# Patient Record
Sex: Female | Born: 1995 | Race: Black or African American | Hispanic: No | Marital: Single | Smoking: Current every day smoker
Health system: Southern US, Community
[De-identification: ages and names within clinical notes are randomized; demographics above are authoritative.]

## PROBLEM LIST (undated history)

## (undated) DIAGNOSIS — J45909 Unspecified asthma, uncomplicated: Secondary | ICD-10-CM

## (undated) DIAGNOSIS — J45901 Unspecified asthma with (acute) exacerbation: Secondary | ICD-10-CM

---
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
## 2014-01-15 ENCOUNTER — Encounter (HOSPITAL_COMMUNITY): Payer: Self-pay | Admitting: Emergency Medicine

## 2014-01-15 ENCOUNTER — Emergency Department (HOSPITAL_COMMUNITY): Payer: Managed Care, Other (non HMO)

## 2014-01-15 ENCOUNTER — Observation Stay (HOSPITAL_COMMUNITY)
Admission: EM | Admit: 2014-01-15 | Discharge: 2014-01-17 | Disposition: A | Discharge status: Home / Self Care | Payer: Managed Care, Other (non HMO) | Attending: Internal Medicine | Admitting: Internal Medicine

## 2014-01-15 DIAGNOSIS — B9789 Other viral agents as the cause of diseases classified elsewhere: Secondary | ICD-10-CM | POA: Diagnosis not present

## 2014-01-15 DIAGNOSIS — J45901 Unspecified asthma with (acute) exacerbation: Principal | ICD-10-CM | POA: Insufficient documentation

## 2014-01-15 DIAGNOSIS — J45902 Unspecified asthma with status asthmaticus: Secondary | ICD-10-CM

## 2014-01-15 DIAGNOSIS — Z720 Tobacco use: Secondary | ICD-10-CM

## 2014-01-15 DIAGNOSIS — I498 Other specified cardiac arrhythmias: Secondary | ICD-10-CM | POA: Diagnosis not present

## 2014-01-15 DIAGNOSIS — F17200 Nicotine dependence, unspecified, uncomplicated: Secondary | ICD-10-CM

## 2014-01-15 DIAGNOSIS — R000 Tachycardia, unspecified: Secondary | ICD-10-CM

## 2014-01-15 DIAGNOSIS — R0602 Shortness of breath: Secondary | ICD-10-CM | POA: Diagnosis present

## 2014-01-15 HISTORY — DX: Unspecified asthma with (acute) exacerbation: J45.901

## 2014-01-15 HISTORY — DX: Unspecified asthma, uncomplicated: J45.909

## 2014-01-15 LAB — URINE MICROSCOPIC-ADD ON

## 2014-01-15 LAB — COMPREHENSIVE METABOLIC PANEL
ALK PHOS: 49 U/L (ref 39–117)
ALT: 21 U/L (ref 0–35)
AST: 32 U/L (ref 0–37)
Albumin: 4.2 g/dL (ref 3.5–5.2)
BUN: 11 mg/dL (ref 6–23)
CO2: 25 meq/L (ref 19–32)
Calcium: 9.4 mg/dL (ref 8.4–10.5)
Chloride: 104 mEq/L (ref 96–112)
Creatinine, Ser: 0.73 mg/dL (ref 0.50–1.10)
GLUCOSE: 92 mg/dL (ref 70–99)
POTASSIUM: 3.7 meq/L (ref 3.7–5.3)
SODIUM: 143 meq/L (ref 137–147)
TOTAL PROTEIN: 8.7 g/dL — AB (ref 6.0–8.3)
Total Bilirubin: 0.3 mg/dL (ref 0.3–1.2)

## 2014-01-15 LAB — CBC WITH DIFFERENTIAL/PLATELET
Basophils Absolute: 0 10*3/uL (ref 0.0–0.1)
Basophils Relative: 2 % — ABNORMAL HIGH (ref 0–1)
EOS ABS: 0 10*3/uL (ref 0.0–0.7)
EOS PCT: 1 % (ref 0–5)
HCT: 41.1 % (ref 36.0–46.0)
Hemoglobin: 14.1 g/dL (ref 12.0–15.0)
LYMPHS ABS: 1.7 10*3/uL (ref 0.7–4.0)
LYMPHS PCT: 64 % — AB (ref 12–46)
MCH: 31.7 pg (ref 26.0–34.0)
MCHC: 34.3 g/dL (ref 30.0–36.0)
MCV: 92.4 fL (ref 78.0–100.0)
MONOS PCT: 13 % — AB (ref 3–12)
Monocytes Absolute: 0.4 10*3/uL (ref 0.1–1.0)
Neutro Abs: 0.6 10*3/uL — ABNORMAL LOW (ref 1.7–7.7)
Neutrophils Relative %: 21 % — ABNORMAL LOW (ref 43–77)
Platelets: 168 10*3/uL (ref 150–400)
RBC: 4.45 MIL/uL (ref 3.87–5.11)
RDW: 12.7 % (ref 11.5–15.5)
WBC: 2.7 10*3/uL — AB (ref 4.0–10.5)

## 2014-01-15 LAB — URINALYSIS, ROUTINE W REFLEX MICROSCOPIC
Bilirubin Urine: NEGATIVE
Glucose, UA: NEGATIVE mg/dL
KETONES UR: NEGATIVE mg/dL
Nitrite: NEGATIVE
PROTEIN: NEGATIVE mg/dL
Specific Gravity, Urine: <1.005 — ABNORMAL LOW (ref 1.005–1.030)
Urobilinogen, UA: 0.2 mg/dL (ref 0.0–1.0)
pH: 5.5 (ref 5.0–8.0)

## 2014-01-15 LAB — TROPONIN I

## 2014-01-15 LAB — PREGNANCY, URINE: PREG TEST UR: NEGATIVE

## 2014-01-15 LAB — D-DIMER, QUANTITATIVE: D-Dimer, Quant: 0.43 ug/mL-FEU (ref 0.00–0.48)

## 2014-01-15 MED ORDER — ALBUTEROL SULFATE (2.5 MG/3ML) 0.083% IN NEBU
2.5000 mg | INHALATION_SOLUTION | Freq: Four times a day (QID) | RESPIRATORY_TRACT | Status: DC | PRN
Start: 2014-01-15 — End: 2014-01-15

## 2014-01-15 MED ORDER — IPRATROPIUM-ALBUTEROL 0.5-2.5 (3) MG/3ML IN SOLN
3.0000 mL | Freq: Once | RESPIRATORY_TRACT | Status: AC
  Administered 2014-01-15: 3 mL via RESPIRATORY_TRACT
  Filled 2014-01-15: qty 3

## 2014-01-15 MED ORDER — METHYLPREDNISOLONE SODIUM SUCC 125 MG IJ SOLR
125.0000 mg | Freq: Once | INTRAMUSCULAR | Status: AC
  Administered 2014-01-15: 125 mg via INTRAVENOUS
  Filled 2014-01-15: qty 2

## 2014-01-15 MED ORDER — ALBUTEROL SULFATE (2.5 MG/3ML) 0.083% IN NEBU
2.5000 mg | INHALATION_SOLUTION | RESPIRATORY_TRACT | Status: AC | PRN
  Filled 2014-01-15: qty 3

## 2014-01-15 MED ORDER — ACETAMINOPHEN 500 MG PO TABS: 1000.0000 mg | ORAL_TABLET | Freq: Four times a day (QID) | ORAL | Status: DC | PRN

## 2014-01-15 MED ORDER — ONDANSETRON HCL 4 MG PO TABS: 4.0000 mg | ORAL_TABLET | Freq: Four times a day (QID) | ORAL | Status: DC | PRN

## 2014-01-15 MED ORDER — IPRATROPIUM-ALBUTEROL 0.5-2.5 (3) MG/3ML IN SOLN
3.0000 mL | Freq: Once | RESPIRATORY_TRACT | Status: DC
  Filled 2014-01-15: qty 3

## 2014-01-15 MED ORDER — ONDANSETRON HCL 4 MG/2ML IJ SOLN
4.0000 mg | Freq: Once | INTRAMUSCULAR | Status: AC
  Administered 2014-01-15: 4 mg via INTRAVENOUS
  Filled 2014-01-15: qty 2

## 2014-01-15 MED ORDER — MAGNESIUM SULFATE 40 MG/ML IJ SOLN
2.0000 g | Freq: Once | INTRAMUSCULAR | Status: AC
  Administered 2014-01-15: 2 g via INTRAVENOUS
  Filled 2014-01-15: qty 50

## 2014-01-15 MED ORDER — ALBUTEROL SULFATE HFA 108 (90 BASE) MCG/ACT IN AERS: 1.0000 | INHALATION_SPRAY | Freq: Four times a day (QID) | RESPIRATORY_TRACT | Status: DC | PRN

## 2014-01-15 MED ORDER — MORPHINE SULFATE 4 MG/ML IJ SOLN
4.0000 mg | Freq: Once | INTRAMUSCULAR | Status: AC
  Administered 2014-01-15: 4 mg via INTRAVENOUS
  Filled 2014-01-15: qty 1

## 2014-01-15 MED ORDER — KETOROLAC TROMETHAMINE 30 MG/ML IJ SOLN
30.0000 mg | Freq: Once | INTRAMUSCULAR | Status: AC
  Administered 2014-01-15: 30 mg via INTRAVENOUS
  Filled 2014-01-15: qty 1

## 2014-01-15 MED ORDER — IPRATROPIUM-ALBUTEROL 0.5-2.5 (3) MG/3ML IN SOLN
3.0000 mL | Freq: Once | RESPIRATORY_TRACT | Status: AC
Start: 2014-01-15 — End: 2014-01-15
  Administered 2014-01-15: 3 mL via RESPIRATORY_TRACT
  Filled 2014-01-15: qty 3

## 2014-01-15 MED ORDER — HEPARIN SODIUM (PORCINE) 5000 UNIT/ML IJ SOLN
5000.0000 [IU] | Freq: Three times a day (TID) | INTRAMUSCULAR | Status: DC
  Administered 2014-01-15 – 2014-01-16 (×3): 5000 [IU] via SUBCUTANEOUS
  Filled 2014-01-15 (×3): qty 1

## 2014-01-15 MED ORDER — METHYLPREDNISOLONE SODIUM SUCC 125 MG IJ SOLR
80.0000 mg | Freq: Four times a day (QID) | INTRAMUSCULAR | Status: DC
  Administered 2014-01-15 – 2014-01-16 (×4): 80 mg via INTRAVENOUS
  Filled 2014-01-15 (×4): qty 2

## 2014-01-15 MED ORDER — IPRATROPIUM-ALBUTEROL 0.5-2.5 (3) MG/3ML IN SOLN
RESPIRATORY_TRACT | Status: AC
  Administered 2014-01-15: 3 mL
  Filled 2014-01-15: qty 3

## 2014-01-15 MED ORDER — ONDANSETRON HCL 4 MG/2ML IJ SOLN: 4.0000 mg | Freq: Four times a day (QID) | INTRAMUSCULAR | Status: DC | PRN

## 2014-01-15 MED ORDER — ALBUTEROL (5 MG/ML) CONTINUOUS INHALATION SOLN
INHALATION_SOLUTION | RESPIRATORY_TRACT | Status: AC
  Filled 2014-01-15: qty 20

## 2014-01-15 MED ORDER — ALBUTEROL SULFATE (2.5 MG/3ML) 0.083% IN NEBU
2.5000 mg | INHALATION_SOLUTION | Freq: Once | RESPIRATORY_TRACT | Status: AC
  Administered 2014-01-15: 2.5 mg via RESPIRATORY_TRACT
  Filled 2014-01-15: qty 3

## 2014-01-15 MED ORDER — ALBUTEROL (5 MG/ML) CONTINUOUS INHALATION SOLN
10.0000 mg/h | INHALATION_SOLUTION | RESPIRATORY_TRACT | Status: DC
  Administered 2014-01-15: 10 mg/h via RESPIRATORY_TRACT

## 2014-01-15 NOTE — H&P (Signed)
[facility] History and Physical  April GoodyBreona Montgomery EAV:[id number] DOB: 25-Oct-1995 DOA: 01/15/2014  Referring physician: ER. PCP: No PCP Per Patient   Chief Complaint: Dyspnea.  HPI: April Montgomery is a 18 y.o. female  This is an 18 year old lady who has had asthma since she was a child who presents with dyspnea for the last 2 days. She says she was initially taking all over her body but there was no fever and then she started to wheeze. She tried to use her inhalers but this was not helpful. She does not have a productive cough nor has she had a fever. Unfortunately, she's continues to smoke almost a pack of cigarettes per day.   Review of Systems:  Constitutional:  No weight loss, night sweats, Fevers, chills, fatigue.  HEENT:  No headaches, Difficulty swallowing,Tooth/dental problems,Sore throat,  No sneezing, itching, ear ache, nasal congestion, post nasal drip,  Cardio-vascular:  No chest pain, Orthopnea, PND, swelling in lower extremities, anasarca, dizziness, palpitations  GI:  No heartburn, indigestion, abdominal pain, nausea, vomiting, diarrhea, change in bowel habits, loss of appetite  Resp:   No excess mucus, no productive cough, No non-productive cough, No coughing up of blood.No change in color of mucus.No wheezing.No chest wall deformity  Skin:  no rash or lesions.  GU:  no dysuria, change in color of urine, no urgency or frequency. No flank pain.  Musculoskeletal:  No joint pain or swelling. No decreased range of motion. No back pain.  Psych:  No change in mood or affect. No depression or anxiety. No memory loss.   Past Medical History  Diagnosis Date  . Asthma   . Asthma attack 01/15/2014   History reviewed. No pertinent past surgical history. Social History:  reports that she has been smoking Cigarettes.  She has been smoking about 0.00 packs per day. She does not have any smokeless tobacco history on file. She reports that she does not drink alcohol or use  illicit drugs.  No Known Allergies  No family history on file.   Prior to Admission medications   Medication Sig Start Date End Date Taking? Authorizing Provider  acetaminophen (TYLENOL) 500 MG tablet Take 1,000 mg by mouth every 6 (six) hours as needed for mild pain.   Yes Historical Provider, MD  albuterol (PROVENTIL HFA;VENTOLIN HFA) 108 (90 BASE) MCG/ACT inhaler Inhale 1-2 puffs into the lungs every 6 (six) hours as needed for wheezing or shortness of breath.   Yes Historical Provider, MD  albuterol (PROVENTIL) (2.5 MG/3ML) 0.083% nebulizer solution Take 2.5 mg by nebulization every 6 (six) hours as needed for wheezing or shortness of breath.   Yes Historical Provider, MD   Physical Exam: Filed Vitals:   01/15/14 1541  BP: 107/50  Pulse: 78  Temp:   Resp: 26    BP 107/50  Pulse 78  Temp(Src) 98.3 F (36.8 C) (Oral)  Resp 26  SpO2 99%  LMP 12/18/2013  Breastfeeding? Unknown  General:  Appears calm and comfortable. She does not appear to be in any respiratory distress at rest. There is no peripheral or central cyanosis. She is talking in sentences. Eyes: PERRL, normal lids, irises & conjunctiva ENT: grossly normal hearing, lips & tongue Neck: no LAD, masses or thyromegaly Cardiovascular: RRR, no m/r/g. No LE edema. Telemetry: SR, no arrhythmias  Respiratory: Bilateral wheezing throughout, the chest is moderately tight. There are no crackles or bronchial breathing. Abdomen: soft, ntnd Skin: no rash or induration seen on limited exam Musculoskeletal: grossly normal tone  BUE/BLE Psychiatric: grossly normal mood and affect, speech fluent and appropriate Neurologic: grossly non-focal.          Labs on Admission:  Basic Metabolic Panel:  Recent Labs Lab 01/15/14 1115  NA 143  K 3.7  CL 104  CO2 25  GLUCOSE 92  BUN 11  CREATININE 0.73  CALCIUM 9.4   Liver Function Tests:  Recent Labs Lab 01/15/14 1115  AST 32  ALT 21  ALKPHOS 49  BILITOT 0.3  PROT 8.7*    ALBUMIN 4.2   No results found for this basename: LIPASE, AMYLASE,  in the last 168 hours No results found for this basename: AMMONIA,  in the last 168 hours CBC:  Recent Labs Lab 01/15/14 1115  WBC 2.7*  NEUTROABS 0.6*  HGB 14.1  HCT 41.1  MCV 92.4  PLT 168   Cardiac Enzymes:  Recent Labs Lab 01/15/14 1115  TROPONINI <0.30    BNP (last 3 results) No results found for this basename: PROBNP,  in the last 8760 hours CBG: No results found for this basename: GLUCAP,  in the last 168 hours  Radiological Exams on Admission: Dg Chest Portable 1 View  01/15/2014   CLINICAL DATA:  Cough and congestion.  EXAM: PORTABLE CHEST - 1 VIEW  COMPARISON:  None.  FINDINGS: Lungs are clear. Heart size is normal. No pneumothorax or pleural effusion. No focal bony abnormality.  IMPRESSION: No acute disease.   Electronically Signed   By: Drusilla Kanner M.D.   On: 01/15/2014 11:37      Assessment/Plan   1. Asthma attack.  Plan: 1. Admit for observation on the medical floor. 2. Intravenous steroids. I do not think she requires any antibiotics at this point. 3. I've counseled her days tobacco abuse.  Further recommendations will depend on hospital progress.   Code Status: Full code.  Family Communication: I discussed the plan with the patient at the bedside.  Disposition Plan: Home when medically stable.   Time spent: 45 minutes.  Wilson Singer [facility] Pager [phone].

## 2014-01-15 NOTE — ED Notes (Signed)
Pt arrives with c/o of upper back pain, between shoulder blades and radiating out. Pt hyperventilating and crying upon initial assessment, asked to take slow deep breaths. States she has asthma "that is acting up as well", c/o of ShOB. Coughing present.

## 2014-01-15 NOTE — ED Provider Notes (Signed)
CSN: [id number]     Arrival date & time 01/15/14  1002 History  This chart was scribed for Glynn OctaveStephen Chelsa Stout, MD by Leone PayorSonum Patel, ED Scribe. This patient was seen in room APA05/APA05 and the patient's care was started 11:00 AM.    Chief Complaint  Patient presents with  . Back Pain  . Shortness of Breath      The history is provided by the patient. No language interpreter was used.    HPI Comments: April Montgomery is a 18 y.o. female with past medical history of asthma who presents to the Emergency Department complaining of 2 days of gradual onset, gradually worsening, constant SOB. She has associated generalized myalgias, cough, and central chest tightness and soreness. She has taken tylenol, used her albuterol inhaler along with a breathing treatment yesterday morning without relief. She has not been hospitalized for asthma before. She denies fever, nausea, vomiting. She denies sick contacts. She is a current daily smoker, less than 1 ppd.   No PCP  Past Medical History  Diagnosis Date  . Asthma   . Asthma attack 01/15/2014   History reviewed. No pertinent past surgical history. No family history on file. History  Substance Use Topics  . Smoking status: Current Every Day Smoker    Types: Cigarettes  . Smokeless tobacco: Not on file  . Alcohol Use: No   OB History   Grav Para Term Preterm Abortions TAB SAB Ect Mult Living   1              Review of Systems  A complete 10 system review of systems was obtained and all systems are negative except as noted in the HPI and PMH.    Allergies  Review of patient's allergies indicates no known allergies.  Home Medications   Current Outpatient Rx  Name  Route  Sig  Dispense  Refill  . acetaminophen (TYLENOL) 500 MG tablet   Oral   Take 1,000 mg by mouth every 6 (six) hours as needed for mild pain.         Marland Kitchen. albuterol (PROVENTIL HFA;VENTOLIN HFA) 108 (90 BASE) MCG/ACT inhaler   Inhalation   Inhale 1-2 puffs into the lungs every  6 (six) hours as needed for wheezing or shortness of breath.         Marland Kitchen. albuterol (PROVENTIL) (2.5 MG/3ML) 0.083% nebulizer solution   Nebulization   Take 2.5 mg by nebulization every 6 (six) hours as needed for wheezing or shortness of breath.          BP 107/50  Pulse 78  Temp(Src) 98.3 F (36.8 C) (Oral)  Resp 26  SpO2 99%  LMP 12/18/2013  Breastfeeding? Unknown Physical Exam  Nursing note and vitals reviewed. Constitutional: She is oriented to person, place, and time. She appears well-developed and well-nourished.  HENT:  Head: Normocephalic and atraumatic.  Neck: Normal range of motion.  Cardiovascular: Normal rate, regular rhythm and normal heart sounds.   Pulmonary/Chest: She is in respiratory distress. She has wheezes. She exhibits tenderness.  Moderate respiratory distress with tachypnea and accessory muscle use. Speaking in short sentences. Diffuse expiratory wheezing bilaterally. Chest wall is tender.   Abdominal: Soft. She exhibits no distension. There is no tenderness.  Musculoskeletal: Normal range of motion. She exhibits tenderness. She exhibits no edema.  No leg pain or swelling bilaterally. Tenderness to palpation to the upper back between scapula     Neurological: She is alert and oriented to person, place, and time.  Skin:  Skin is warm and dry.  Psychiatric: She has a normal mood and affect.    ED Course  Procedures (including critical care time)  DIAGNOSTIC STUDIES: Oxygen Saturation is 100% on RA, normal by my interpretation.    COORDINATION OF CARE: 11:07 AM Discussed treatment plan with pt at bedside and pt agreed to plan.  12:17 PM Upon recheck, patient states she continues to have pain. Discussed negative imaging and lab results.    Labs Review Labs Reviewed  CBC WITH DIFFERENTIAL - Abnormal; Notable for the following:    WBC 2.7 (*)    Neutrophils Relative % 21 (*)    Neutro Abs 0.6 (*)    Lymphocytes Relative 64 (*)    Monocytes  Relative 13 (*)    Basophils Relative 2 (*)    All other components within normal limits  COMPREHENSIVE METABOLIC PANEL - Abnormal; Notable for the following:    Total Protein 8.7 (*)    All other components within normal limits  URINALYSIS, ROUTINE W REFLEX MICROSCOPIC - Abnormal; Notable for the following:    Specific Gravity, Urine <1.005 (*)    Hgb urine dipstick SMALL (*)    Leukocytes, UA SMALL (*)    All other components within normal limits  URINE MICROSCOPIC-ADD ON - Abnormal; Notable for the following:    Squamous Epithelial / LPF MANY (*)    Bacteria, UA FEW (*)    All other components within normal limits  TROPONIN I  D-DIMER, QUANTITATIVE  PREGNANCY, URINE   Imaging Review Dg Chest Portable 1 View  01/15/2014   CLINICAL DATA:  Cough and congestion.  EXAM: PORTABLE CHEST - 1 VIEW  COMPARISON:  None.  FINDINGS: Lungs are clear. Heart size is normal. No pneumothorax or pleural effusion. No focal bony abnormality.  IMPRESSION: No acute disease.   Electronically Signed   By: Drusilla Kanner M.D.   On: 01/15/2014 11:37     EKG Interpretation   Date/Time:  Monday January 15 2014 10:38:17 EDT Ventricular Rate:  83 PR Interval:  112 QRS Duration: 78 QT Interval:  376 QTC Calculation: 441 R Axis:   76 Text Interpretation:  Normal sinus rhythm with sinus arrhythmia Normal ECG  No previous ECGs available No previous ECGs available Confirmed by Ananth Fiallos   MD, Clemens Lachman ([id number]) on 01/15/2014 10:53:28 AM      MDM   Final diagnoses:  Status asthmaticus   Shortness of breath, coughing, wheezing ongoing for the past 2 days. Denies any fevers. He has some chest tightness and upper back pain with coughing. She is a smoker. She is albuterol home twice e. She's never been hospitalized for asthma.  Patient is anxious, tachypnea, moderate respiratory distress with respiratory wheezing bilaterally. She's given nebulizers, steroids, magnesium.  EKG with normal sinus rhythm. Tachycardia  is improved. Chest x-ray is negative. Troponin and d-dimer negative.  Wheezing continues despite nebulizers.  She has had 3 duonebs and 1 continuous neb with steroids and magnesium.  No hypoxia, even with ambulation. She is speaking in short sentences.  With continued wheezing and work of breathing, will admit.   CRITICAL CARE Performed by: Glynn Octave Total critical care time:30 Critical care time was exclusive of separately billable procedures and treating other patients. Critical care was necessary to treat or prevent imminent or life-threatening deterioration. Critical care was time spent personally by me on the following activities: development of treatment plan with patient and/or surrogate as well as nursing, discussions with consultants, evaluation of patient's response to  treatment, examination of patient, obtaining history from patient or surrogate, ordering and performing treatments and interventions, ordering and review of laboratory studies, ordering and review of radiographic studies, pulse oximetry and re-evaluation of patient's condition.    I personally performed the services described in this documentation, which was scribed in my presence. The recorded information has been reviewed and is accurate.   Glynn Octave, MD 01/15/14 [phone]

## 2014-01-15 NOTE — ED Notes (Signed)
Pt walked without any dyspnea. Oxygen stayed above 96% and pulse ranged from 105 - 115.

## 2014-01-15 NOTE — ED Notes (Signed)
Pt's sats 100% after breathing Tx, pt's wheezing better but still easily auscultated.

## 2014-01-15 NOTE — Care Management Note (Signed)
ED/CM noted patient did not have health insurance and/or PCP listed in the computer.  Patient was given the [place] resource handout with information on the clinics, food pantries, and the handout for new health insurance sign-up.  Patient expressed appreciation for information received. 

## 2014-01-16 DIAGNOSIS — J45902 Unspecified asthma with status asthmaticus: Secondary | ICD-10-CM

## 2014-01-16 DIAGNOSIS — J45901 Unspecified asthma with (acute) exacerbation: Secondary | ICD-10-CM | POA: Diagnosis not present

## 2014-01-16 LAB — CBC
HEMATOCRIT: 39.4 % (ref 36.0–46.0)
Hemoglobin: 13.6 g/dL (ref 12.0–15.0)
MCH: 32 pg (ref 26.0–34.0)
MCHC: 34.5 g/dL (ref 30.0–36.0)
MCV: 92.7 fL (ref 78.0–100.0)
PLATELETS: 201 10*3/uL (ref 150–400)
RBC: 4.25 MIL/uL (ref 3.87–5.11)
RDW: 12.8 % (ref 11.5–15.5)
WBC: 6.1 10*3/uL (ref 4.0–10.5)

## 2014-01-16 LAB — COMPREHENSIVE METABOLIC PANEL
ALBUMIN: 3.9 g/dL (ref 3.5–5.2)
ALT: 18 U/L (ref 0–35)
AST: 23 U/L (ref 0–37)
Alkaline Phosphatase: 43 U/L (ref 39–117)
BUN: 11 mg/dL (ref 6–23)
CALCIUM: 9.4 mg/dL (ref 8.4–10.5)
CO2: 22 mEq/L (ref 19–32)
CREATININE: 0.58 mg/dL (ref 0.50–1.10)
Chloride: 103 mEq/L (ref 96–112)
GFR calc Af Amer: >90 mL/min (ref >90)
GFR calc non Af Amer: >90 mL/min (ref >90)
Glucose, Bld: 129 mg/dL — ABNORMAL HIGH (ref 70–99)
Potassium: 3.7 mEq/L (ref 3.7–5.3)
Sodium: 141 mEq/L (ref 137–147)
TOTAL PROTEIN: 8.4 g/dL — AB (ref 6.0–8.3)
Total Bilirubin: 0.2 mg/dL — ABNORMAL LOW (ref 0.3–1.2)

## 2014-01-16 MED ORDER — NICOTINE 14 MG/24HR TD PT24
14.0000 mg | MEDICATED_PATCH | Freq: Every day | TRANSDERMAL | Status: DC
  Administered 2014-01-16 – 2014-01-17 (×2): 14 mg via TRANSDERMAL
  Filled 2014-01-16 (×2): qty 1

## 2014-01-16 MED ORDER — ALBUTEROL SULFATE (2.5 MG/3ML) 0.083% IN NEBU: 2.5000 mg | INHALATION_SOLUTION | RESPIRATORY_TRACT | Status: DC | PRN

## 2014-01-16 MED ORDER — ALBUTEROL SULFATE (2.5 MG/3ML) 0.083% IN NEBU
2.5000 mg | INHALATION_SOLUTION | Freq: Three times a day (TID) | RESPIRATORY_TRACT | Status: DC
  Administered 2014-01-16: 2.5 mg via RESPIRATORY_TRACT
  Filled 2014-01-16: qty 3

## 2014-01-16 MED ORDER — ALBUTEROL SULFATE (2.5 MG/3ML) 0.083% IN NEBU
2.5000 mg | INHALATION_SOLUTION | RESPIRATORY_TRACT | Status: DC | PRN
  Administered 2014-01-16: 2.5 mg via RESPIRATORY_TRACT

## 2014-01-16 MED ORDER — POLYETHYLENE GLYCOL 3350 17 G PO PACK: 17.0000 g | PACK | Freq: Every day | ORAL | Status: DC

## 2014-01-16 MED ORDER — DOCUSATE SODIUM 100 MG PO CAPS
100.0000 mg | ORAL_CAPSULE | Freq: Two times a day (BID) | ORAL | Status: DC
  Administered 2014-01-16 – 2014-01-17 (×2): 100 mg via ORAL
  Filled 2014-01-16 (×3): qty 1

## 2014-01-16 MED ORDER — POLYETHYLENE GLYCOL 3350 17 G PO PACK
17.0000 g | PACK | Freq: Every day | ORAL | Status: DC | PRN
  Administered 2014-01-16: 17 g via ORAL
  Filled 2014-01-16: qty 1

## 2014-01-16 MED ORDER — SENNA 8.6 MG PO TABS
2.0000 | ORAL_TABLET | Freq: Every day | ORAL | Status: DC
  Filled 2014-01-16: qty 2

## 2014-01-16 MED ORDER — PREDNISONE 20 MG PO TABS
60.0000 mg | ORAL_TABLET | Freq: Two times a day (BID) | ORAL | Status: DC
  Administered 2014-01-16 – 2014-01-17 (×2): 60 mg via ORAL
  Filled 2014-01-16 (×2): qty 3

## 2014-01-16 MED ORDER — IPRATROPIUM-ALBUTEROL 0.5-2.5 (3) MG/3ML IN SOLN
3.0000 mL | RESPIRATORY_TRACT | Status: DC
  Administered 2014-01-16 – 2014-01-17 (×5): 3 mL via RESPIRATORY_TRACT
  Filled 2014-01-16 (×5): qty 3

## 2014-01-16 MED ORDER — ENOXAPARIN SODIUM 40 MG/0.4ML ~~LOC~~ SOLN
40.0000 mg | SUBCUTANEOUS | Status: DC
  Administered 2014-01-16 – 2014-01-17 (×2): 40 mg via SUBCUTANEOUS
  Filled 2014-01-16 (×2): qty 0.4

## 2014-01-16 NOTE — Progress Notes (Signed)
Nutrition Brief Note  Patient identified on the Malnutrition Screening Tool (MST) Report  Wt Readings from Last 15 Encounters:  01/15/14 150 lb 8 oz (68.266 kg) (83%*, Z = 0.96)  06/17/12 191 lb 8 oz (86.864 kg) (97%*, Z = 1.90)   * Growth percentiles are based on CDC 2-20 Years data.   Pt reports good appetite PTA and during hospitalization. She confirms intentional weight loss, reporting that she is trying to lose weight by eating smaller portions ("I don't eat the way that I used to").  Body mass index is 27.52 kg/(m^2). Patient meets criteria for overweight based on current BMI.   Current diet order is regular, patient is consuming approximately 100% of meals at this time. Labs and medications reviewed.   No nutrition interventions warranted at this time. If nutrition issues arise, please consult RD.   Kizzy Olafson A. Mayford KnifeWilliams, RD, LDN Pager: [phone]

## 2014-01-16 NOTE — Progress Notes (Signed)
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 She does feel better than what he came to the hospital yesterday.   Objective: Filed Vitals:   01/15/14 2154 01/16/14 0427 01/16/14 0438 01/16/14 0644  BP: 108/61  108/64   Pulse: 54  70   Temp: 97 F (36.1 C)  97.3 F (36.3 C)   TempSrc:   Oral   Resp: 18  20   Height:      Weight:      SpO2: 96% 98% 98% 96%    Intake/Output Summary (Last 24 hours) at 01/16/14 1104 Last data filed at 01/16/14 1023  Gross per 24 hour  Intake    480 ml  Output      1 ml  Net    479 ml   Filed Weights   01/15/14 1700  Weight: 68.266 kg (150 lb 8 oz)    Exam:   General:   African American female, frequent cough, audible wheezing, and April Montgomery of breath when talking   HEENT:  NCAT, MMM  Cardiovascular:  RRR, nl S1, S2 no mrg, 2+ pulses, warm extremities  Respiratory:  rhonchorous breath sounds with full expiratory wheeze, very diminished at the bases, no focal rales, no increased WOB  Abdomen:   NABS, soft, NT/ND  MSK:   Normal tone and bulk, no LEE  Neuro:  Grossly  intact  Data Reviewed: Basic Metabolic Panel:  Recent Labs Lab 01/15/14 1115 01/16/14 0457  NA 143 141  K 3.7 3.7  CL 104 103  CO2 25 22  GLUCOSE 92 129*  BUN 11 11  CREATININE 0.73 0.58  CALCIUM 9.4 9.4   Liver Function Tests:  Recent Labs Lab 01/15/14 1115 01/16/14 0457  AST 32 23  ALT 21 18  ALKPHOS 49 43  BILITOT 0.3 0.2*  PROT 8.7* 8.4*  ALBUMIN 4.2 3.9   No results found for this basename: LIPASE, AMYLASE,  in the last 168 hours No results found for this basename: AMMONIA,  in the last 168 hours CBC:  Recent Labs Lab 01/15/14 1115 01/16/14 0457  WBC 2.7* 6.1  NEUTROABS 0.6*  --   HGB 14.1 13.6  HCT 41.1 39.4  MCV 92.4 92.7  PLT 168 201   Cardiac Enzymes:  Recent Labs Lab 01/15/14 1115  TROPONINI <0.30   BNP (last 3 results) No results found for this basename: PROBNP,  in the last 8760 hours CBG: No results found for this basename: GLUCAP,  in the last 168 hours  No results found for this or any previous visit (from the past 240 hour(s)).   Studies: Dg Chest Portable 1 View  01/15/2014  CLINICAL DATA:  Cough and congestion.  EXAM: PORTABLE CHEST - 1 VIEW  COMPARISON:  None.  FINDINGS: Lungs are clear. Heart size is normal. No pneumothorax or pleural effusion. No focal bony abnormality.  IMPRESSION: No acute disease.   Electronically Signed   By: Drusilla Kannerhomas  Dalessio M.D.   On: 01/15/2014 11:37    Scheduled Meds: . docusate sodium  100 mg Oral BID  . heparin  5,000 Units Subcutaneous 3 times per day  . ipratropium-albuterol  3 mL Nebulization Once  . ipratropium-albuterol  3 mL Nebulization Q4H  . methylPREDNISolone (SOLU-MEDROL) injection  80 mg Intravenous Q6H  . senna  2 tablet Oral QHS   Continuous Infusions:   Active Problems:   Asthma attack   Tobacco abuse    Time spent: 30 min    April Montgomery, [facility] CenterMACKENZIE  [facility] Pager [phone]. If 7PM-7AM, please contact night-coverage at [email or web address], password [email or web address] 01/16/2014, 11:04  AM  LOS: 1 day

## 2014-01-16 NOTE — Progress Notes (Signed)
ANTICOAGULATION CONSULT NOTE - Initial Consult  Pharmacy Consult for Lovenox Indication: VTE prophylaxis  No Known Allergies  Patient Measurements: Height: 5\' 2"  (157.5 cm) Weight: 150 lb 8 oz (68.266 kg) IBW/kg (Calculated) : 50.1  Vital Signs: Temp: 97.3 F (36.3 C) (04/07 0438) Temp src: Oral (04/07 0438) BP: 108/64 mmHg (04/07 0438) Pulse Rate: 70 (04/07 0438)  Labs:  Recent Labs  01/15/14 1115 01/16/14 0457  HGB 14.1 13.6  HCT 41.1 39.4  PLT 168 201  CREATININE 0.73 0.58  TROPONINI <0.30  --    Estimated Creatinine Clearance: 103.3 ml/min (by C-G formula based on Cr of 0.58).  Medical History: Past Medical History  Diagnosis Date  . Asthma   . Asthma attack 01/15/2014   Assessment: 18yo female.  Asked to initiate Lovenox for VTE px.  Pt has good renal fxn.  Estimated Creatinine Clearance: 103.3 ml/min (by C-G formula based on Cr of 0.58).  Goal of Therapy:  VTE prophylaxis Monitor platelets by anticoagulation protocol: Yes   Plan:  Lovenox 40mg  SQ q24hrs Monitor labs, CBC  Valrie HartHall, Palma Buster A 01/16/2014,1:09 PM

## 2014-01-17 DIAGNOSIS — R000 Tachycardia, unspecified: Secondary | ICD-10-CM

## 2014-01-17 DIAGNOSIS — I498 Other specified cardiac arrhythmias: Secondary | ICD-10-CM

## 2014-01-17 DIAGNOSIS — J45901 Unspecified asthma with (acute) exacerbation: Secondary | ICD-10-CM | POA: Diagnosis not present

## 2014-01-17 MED ORDER — MOMETASONE FURO-FORMOTEROL FUM 200-5 MCG/ACT IN AERO: 2.0000 | INHALATION_SPRAY | Freq: Two times a day (BID) | RESPIRATORY_TRACT | Status: DC

## 2014-01-17 MED ORDER — MOMETASONE FURO-FORMOTEROL FUM 200-5 MCG/ACT IN AERO
2.0000 | INHALATION_SPRAY | Freq: Two times a day (BID) | RESPIRATORY_TRACT | Status: DC
  Filled 2014-01-17: qty 8.8

## 2014-01-17 MED ORDER — PREDNISONE 20 MG PO TABS: ORAL_TABLET | ORAL | Status: DC

## 2014-01-17 MED ORDER — NICOTINE 14 MG/24HR TD PT24: 14.0000 mg | MEDICATED_PATCH | Freq: Every day | TRANSDERMAL | Status: DC

## 2014-01-17 NOTE — Discharge Instructions (Signed)

## 2014-01-17 NOTE — Care Management Note (Signed)
    Page 1 of 1   01/17/2014     9:57:52 AM   CARE MANAGEMENT NOTE 01/17/2014  Patient:  April Montgomery,April Montgomery   Account Number:  [id number]  Date Initiated:  01/17/2014  Documentation initiated by:  Sharrie RothmanBLACKWELL,Sheyann Sulton C  Subjective/Objective Assessment:   Pt admitted from home with asthma exacerbation. Pt lives with her grandmother and will return home at discharge. Pt is independent with ADL's. Pt has a neb machine for home use.     Action/Plan:   Will establish pt PCP appt with Dr. Karilyn CotaGosrani and appt will be documented on AVS. No other CM needs noted.   Anticipated DC Date:  01/17/2014   Anticipated DC Plan:  HOME/SELF CARE      DC Planning Services  CM consult  Follow-up appt scheduled      Choice offered to / List presented to:             Status of service:  Completed, signed off Medicare Important Message given?   (If response is "NO", the following Medicare IM given date fields will be blank) Date Medicare IM given:   Date Additional Medicare IM given:    Discharge Disposition:  HOME/SELF CARE  Per UR Regulation:    If discussed at Long Length of Stay Meetings, dates discussed:    Comments:  01/17/14 0945 Arlyss Queenammy Marigold Mom, RN BSN CM

## 2014-01-17 NOTE — Discharge Summary (Signed)
Physician Discharge Summary  Zyria Fiscus RUE:[id number] DOB: 1996-08-05 DOA: 01/15/2014  PCP: No PCP Per Patient  Admit date: 01/15/2014 Discharge date: 01/17/2014  Recommendations for Outpatient Follow-up:  1. Followup with primary care doctor in one week. Please followup on asthma symptoms.    Discharge Diagnoses:  Active Problems:   Asthma attack   Tobacco abuse   Sinus tachycardia   Discharge Condition: Stable, improved  Diet recommendation: Regular  Wt Readings from Last 3 Encounters:  01/15/14 68.266 kg (150 lb 8 oz) (83%*, Z = 0.96)  06/17/12 86.864 kg (191 lb 8 oz) (97%*, Z = 1.90)   * Growth percentiles are based on CDC 2-20 Years data.    History of present illness:  April Montgomery is a 18 y.o. female  This is an 18 year old lady who has had asthma since she was a child who presents with dyspnea for the last 2 days. She says she was initially taking all over her body but there was no fever and then she started to wheeze. She tried to use her inhalers but this was not helpful. She does not have a productive cough nor has she had a fever. Unfortunately, she's continues to smoke almost a pack of cigarettes per day.    Hospital Course:   Acute asthma exacerbation triggered by viral illness and cigarette smoking. She was started on IV Solu-Medrol. She was given proper dilators every 4 hours. She remained on room air but was tachypneic and Keelan Pomerleau of breath even at rest initially. At the time of discharge, she is able to ambulate around the room without significant shortness of breath. She is advised to take tapering dose of prednisone over the next couple of weeks. No indication for antibiotics in this asthma patient. She was restarted on healed corticosteroid and long-acting beta agonist. She states she was on this medication previously, however her prescription ran out. She also does not have a primary care doctor. Case management worked with the patient to establish her primary  care doctor appointment for as soon as possible. The patient has Medicaid and should be able to afford her any prescriptions.  Cigarette nicotine abuse and dependence with withdrawal. Her family and friends also smoke. She was advised to quit smoking as this may save her life. She was given a prescription for nicotine patch. I encouraged her family and friends to assist her in stopping smoking.  Sinus tachycardia was likely secondary to bronchodilators. Her telemetry was discontinued. Her heart rate trended down as her breathing improved.    Consultants:  none Procedures:  none Antibiotics:  none    Discharge Exam: Filed Vitals:   01/17/14 0623  BP: 90/60  Pulse:   Temp: 97.4 F (36.3 C)  Resp: 20   Filed Vitals:   01/16/14 2255 01/16/14 2348 01/17/14 0623 01/17/14 0645  BP: 109/67  90/60   Pulse:      Temp: 98.1 F (36.7 C)  97.4 F (36.3 C)   TempSrc: Oral  Oral   Resp: 20  20   Height:      Weight:      SpO2: 96% 97% 99% 99%    General: African American female, walking around room and able to converse with me simultaneously without significant increased WOB HEENT: NCAT, MMM  Cardiovascular: RRR, nl S1, S2 no mrg, 2+ pulses, warm extremities  Respiratory: Diminished bilateral breath sounds with decreased wheeze and improved aeration since yesterday. No focal rales or rhonchi.  no increased WOB  Abdomen: NABS,  soft, NT/ND  MSK: Normal tone and bulk, no LEE  Neuro: Grossly intact   Discharge Instructions      Discharge Orders   Future Orders Complete By Expires   Call MD for:  difficulty breathing, headache or visual disturbances  As directed    Call MD for:  extreme fatigue  As directed    Call MD for:  hives  As directed    Call MD for:  persistant dizziness or light-headedness  As directed    Call MD for:  persistant nausea and vomiting  As directed    Call MD for:  severe uncontrolled pain  As directed    Call MD for:  temperature >100.4  As directed     Diet general  As directed    Discharge instructions  As directed    Increase activity slowly  As directed        Medication List         acetaminophen 500 MG tablet  Commonly known as:  TYLENOL  Take 1,000 mg by mouth every 6 (six) hours as needed for mild pain.     albuterol (2.5 MG/3ML) 0.083% nebulizer solution  Commonly known as:  PROVENTIL  Take 2.5 mg by nebulization every 6 (six) hours as needed for wheezing or shortness of breath.     albuterol 108 (90 BASE) MCG/ACT inhaler  Commonly known as:  PROVENTIL HFA;VENTOLIN HFA  Inhale 1-2 puffs into the lungs every 6 (six) hours as needed for wheezing or shortness of breath.     mometasone-formoterol 200-5 MCG/ACT Aero  Commonly known as:  DULERA  Inhale 2 puffs into the lungs 2 (two) times daily.     nicotine 14 mg/24hr patch  Commonly known as:  NICODERM CQ - dosed in mg/24 hours  Place 1 patch (14 mg total) onto the skin daily.     predniSONE 20 MG tablet  Commonly known as:  DELTASONE  Take 3 tabs daily x 3 days, then 2 tabs daily x 3 days, 1 tab daily x 3 days, 1/2 tab daily x 4 days, then stop       Follow-up Information   Follow up with Primary care doctor. Schedule an appointment as soon as possible for a visit in 1 week.       The results of significant diagnostics from this hospitalization (including imaging, microbiology, ancillary and laboratory) are listed below for reference.    Significant Diagnostic Studies: Dg Chest Portable 1 View  01/15/2014   CLINICAL DATA:  Cough and congestion.  EXAM: PORTABLE CHEST - 1 VIEW  COMPARISON:  None.  FINDINGS: Lungs are clear. Heart size is normal. No pneumothorax or pleural effusion. No focal bony abnormality.  IMPRESSION: No acute disease.   Electronically Signed   By: Drusilla Kannerhomas  Dalessio M.D.   On: 01/15/2014 11:37    Microbiology: No results found for this or any previous visit (from the past 240 hour(s)).   Labs: Basic Metabolic Panel:  Recent Labs Lab  01/15/14 1115 01/16/14 0457  NA 143 141  K 3.7 3.7  CL 104 103  CO2 25 22  GLUCOSE 92 129*  BUN 11 11  CREATININE 0.73 0.58  CALCIUM 9.4 9.4   Liver Function Tests:  Recent Labs Lab 01/15/14 1115 01/16/14 0457  AST 32 23  ALT 21 18  ALKPHOS 49 43  BILITOT 0.3 0.2*  PROT 8.7* 8.4*  ALBUMIN 4.2 3.9   No results found for this basename: LIPASE, AMYLASE,  in  the last 168 hours No results found for this basename: AMMONIA,  in the last 168 hours CBC:  Recent Labs Lab 01/15/14 1115 01/16/14 0457  WBC 2.7* 6.1  NEUTROABS 0.6*  --   HGB 14.1 13.6  HCT 41.1 39.4  MCV 92.4 92.7  PLT 168 201   Cardiac Enzymes:  Recent Labs Lab 01/15/14 1115  TROPONINI <0.30   BNP: BNP (last 3 results) No results found for this basename: PROBNP,  in the last 8760 hours CBG: No results found for this basename: GLUCAP,  in the last 168 hours  Time coordinating discharge: 45 minutes  Signed:  Renae Fickle  [facility] 01/17/2014, 9:23 AM

## 2014-04-10 ENCOUNTER — Encounter (HOSPITAL_COMMUNITY): Payer: Self-pay | Admitting: Emergency Medicine

## 2014-04-10 ENCOUNTER — Emergency Department (HOSPITAL_COMMUNITY)
Admission: EM | Admit: 2014-04-10 | Discharge: 2014-04-10 | Discharge status: Against Medical Advice | Payer: Managed Care, Other (non HMO) | Attending: Emergency Medicine | Admitting: Emergency Medicine

## 2014-04-10 DIAGNOSIS — J45901 Unspecified asthma with (acute) exacerbation: Secondary | ICD-10-CM | POA: Diagnosis not present

## 2014-04-10 DIAGNOSIS — F17200 Nicotine dependence, unspecified, uncomplicated: Secondary | ICD-10-CM | POA: Insufficient documentation

## 2014-04-10 DIAGNOSIS — M436 Torticollis: Secondary | ICD-10-CM | POA: Diagnosis present

## 2014-04-10 NOTE — ED Notes (Signed)
Pt states she woke up yesterday with a stiff neck. Pain is worse today and that is why she came to ED.

## 2014-04-10 NOTE — ED Notes (Signed)
Pt left before being seen or treated by PAC.

## 2014-04-10 NOTE — ED Notes (Signed)
Pt woke up with lt sided neck stiffness yesterday, pain worse today, pt has not taken any medication for relief.

## 2014-06-23 ENCOUNTER — Encounter (HOSPITAL_COMMUNITY): Payer: Self-pay | Admitting: Emergency Medicine

## 2014-06-23 ENCOUNTER — Emergency Department (HOSPITAL_COMMUNITY)
Admission: EM | Admit: 2014-06-23 | Discharge: 2014-06-23 | Disposition: A | Discharge status: Home / Self Care | Payer: Managed Care, Other (non HMO) | Attending: Emergency Medicine | Admitting: Emergency Medicine

## 2014-06-23 DIAGNOSIS — J45909 Unspecified asthma, uncomplicated: Secondary | ICD-10-CM | POA: Diagnosis not present

## 2014-06-23 DIAGNOSIS — Y929 Unspecified place or not applicable: Secondary | ICD-10-CM | POA: Diagnosis not present

## 2014-06-23 DIAGNOSIS — Y939 Activity, unspecified: Secondary | ICD-10-CM | POA: Insufficient documentation

## 2014-06-23 DIAGNOSIS — W57XXXA Bitten or stung by nonvenomous insect and other nonvenomous arthropods, initial encounter: Secondary | ICD-10-CM | POA: Diagnosis not present

## 2014-06-23 DIAGNOSIS — R21 Rash and other nonspecific skin eruption: Secondary | ICD-10-CM | POA: Insufficient documentation

## 2014-06-23 DIAGNOSIS — Z87891 Personal history of nicotine dependence: Secondary | ICD-10-CM | POA: Diagnosis not present

## 2014-06-23 DIAGNOSIS — S30860A Insect bite (nonvenomous) of lower back and pelvis, initial encounter: Secondary | ICD-10-CM | POA: Insufficient documentation

## 2014-06-23 DIAGNOSIS — Z79899 Other long term (current) drug therapy: Secondary | ICD-10-CM | POA: Diagnosis not present

## 2014-06-23 DIAGNOSIS — IMO0001 Reserved for inherently not codable concepts without codable children: Secondary | ICD-10-CM | POA: Insufficient documentation

## 2014-06-23 MED ORDER — PREDNISONE 10 MG PO TABS: ORAL_TABLET | ORAL | Status: DC

## 2014-06-23 MED ORDER — DIPHENHYDRAMINE HCL 25 MG PO TABS: 25.0000 mg | ORAL_TABLET | ORAL | Status: DC | PRN

## 2014-06-23 NOTE — Discharge Instructions (Signed)
Insect Bite Mosquitoes, flies, fleas, bedbugs, and other insects can bite. Insect bites are different from insect stings. The bite may be red, puffy (swollen), and itchy for 2 to 4 days. Most bites get better on their own. HOME CARE   Do not scratch the bite.  Keep the bite clean and dry. Wash the bite with soap and water.  Put ice on the bite.  Put ice in a plastic bag.  Place a towel between your skin and the bag.  Leave the ice on for 20 minutes, 4 times a day. Do this for the first 2 to 3 days, or as told by your doctor.  You may use medicated lotions or creams to lessen itching as told by your doctor.  Only take medicines as told by your doctor.  If you are given medicines (antibiotics), take them as told. Finish them even if you start to feel better. You may need a tetanus shot if:  You cannot remember when you had your last tetanus shot.  You have never had a tetanus shot.  The injury broke your skin. If you need a tetanus shot and you choose not to have one, you may get tetanus. Sickness from tetanus can be serious. GET HELP RIGHT AWAY IF:   You have more pain, redness, or puffiness.  You see a red line on the skin coming from the bite.  You have a fever.  You have joint pain.  You have a headache or neck pain.  You feel weak.  You have a rash.  You have chest pain, or you are short of breath.  You have belly (abdominal) pain.  You feel sick to your stomach (nauseous) or throw up (vomit).  You feel very tired or sleepy. MAKE SURE YOU:   Understand these instructions.  Will watch your condition.  Will get help right away if you are not doing well or get worse. Document Released: 09/25/2000 Document Revised: 12/21/2011 Document Reviewed: 04/29/2011 Exit[employer] Patient Information 2015 [employer]. This information is not intended to replace advice given to you by your health care provider. Make sure you discuss any questions you have with your health  care provider.  

## 2014-06-23 NOTE — ED Notes (Signed)
Pt reports generalized rash since last night. nad noted. Pt denies any fever/nausea.

## 2014-06-25 NOTE — ED Provider Notes (Signed)
CSN: [id number]     Arrival date & time 06/23/14  1707 History   First MD Initiated Contact with Patient 06/23/14 1717     Chief Complaint  Patient presents with  . Rash     (Consider location/radiation/quality/duration/timing/severity/associated sxs/prior Treatment) HPI   Lyris Hitchman is a 18 y.o. female who presents to the Emergency Department complaining of itching "red bumps" to her trunk and bilateral arms.  She states that she noticed the rash after being outside.  She denies fever, chills, sore throat, joint pains or tick bite. She also denies new medications or detergents.   She has not tried any medications prior to ED arrival.     Past Medical History  Diagnosis Date  . Asthma   . Asthma attack 01/15/2014   History reviewed. No pertinent past surgical history. History reviewed. No pertinent family history. History  Substance Use Topics  . Smoking status: Former Smoker    Types: Cigarettes  . Smokeless tobacco: Not on file  . Alcohol Use: No   OB History   Grav Para Term Preterm Abortions TAB SAB Ect Mult Living   1              Review of Systems  Constitutional: Negative for fever, chills, activity change and appetite change.  HENT: Negative for facial swelling, sore throat and trouble swallowing.   Respiratory: Negative for chest tightness, shortness of breath and wheezing.   Gastrointestinal: Negative for nausea and vomiting.  Genitourinary: Negative for dysuria.  Musculoskeletal: Negative for neck pain and neck stiffness.  Skin: Positive for rash. Negative for wound.  Neurological: Negative for dizziness, weakness, numbness and headaches.  All other systems reviewed and are negative.     Allergies  Review of patient's allergies indicates no known allergies.  Home Medications   Prior to Admission medications   Medication Sig Start Date End Date Taking? Authorizing Provider  acetaminophen (TYLENOL) 500 MG tablet Take 1,000 mg by mouth every 6 (six)  hours as needed for mild pain.    Historical Provider, MD  albuterol (PROVENTIL HFA;VENTOLIN HFA) 108 (90 BASE) MCG/ACT inhaler Inhale 1-2 puffs into the lungs every 6 (six) hours as needed for wheezing or shortness of breath.    Historical Provider, MD  albuterol (PROVENTIL) (2.5 MG/3ML) 0.083% nebulizer solution Take 2.5 mg by nebulization every 6 (six) hours as needed for wheezing or shortness of breath.    Historical Provider, MD  diphenhydrAMINE (BENADRYL) 25 MG tablet Take 1 tablet (25 mg total) by mouth every 4 (four) hours as needed for itching. 06/23/14   Emmelia Holdsworth L. Rivkah Wolz, PA-C  mometasone-formoterol (DULERA) 200-5 MCG/ACT AERO Inhale 2 puffs into the lungs 2 (two) times daily. 01/17/14   Renae Fickle, MD  nicotine (NICODERM CQ - DOSED IN MG/24 HOURS) 14 mg/24hr patch Place 1 patch (14 mg total) onto the skin daily. 01/17/14   Renae Fickle, MD  predniSONE (DELTASONE) 10 MG tablet Take 6 tablets day one, 5 tablets day two, 4 tablets day three, 3 tablets day four, 2 tablets day five, then 1 tablet day six 06/23/14   Talina Pleitez L. Ade Stmarie, PA-C  predniSONE (DELTASONE) 20 MG tablet Take 3 tabs daily x 3 days, then 2 tabs daily x 3 days, 1 tab daily x 3 days, 1/2 tab daily x 4 days, then stop 01/17/14   Renae Fickle, MD   BP 119/71  Pulse 100  Temp(Src) 97.9 F (36.6 C) (Oral)  Resp 18  Ht  (1.575 m)  Wt 165 lb (74.844 kg)  BMI 30.17 kg/m2  SpO2 100%  LMP 05/23/2014 Physical Exam  Nursing note and vitals reviewed. Constitutional: She is oriented to person, place, and time. She appears well-developed and well-nourished. No distress.  HENT:  Head: Normocephalic and atraumatic.  Mouth/Throat: Oropharynx is clear and moist.  Neck: Normal range of motion. Neck supple.  Cardiovascular: Normal rate, regular rhythm, normal heart sounds and intact distal pulses.   No murmur heard. Pulmonary/Chest: Effort normal and breath sounds normal. No respiratory distress.  Musculoskeletal: She  exhibits no edema and no tenderness.  Lymphadenopathy:    She has no cervical adenopathy.  Neurological: She is alert and oriented to person, place, and time. She exhibits normal muscle tone. Coordination normal.  Skin: Skin is warm. Rash noted. There is erythema.  Multiple small erythematous papules to the bilateral UE's and upper trunk.  Papules appear in a linear tracking type pattern.  No surrounding erythema or edema.  No vesicles or pustules    ED Course  Procedures (including critical care time) Labs Review Labs Reviewed - No data to display  Imaging Review No results found.   EKG Interpretation None      MDM   Final diagnoses:  Insect bites    Pt is well appearing.  Non-toxic.  Rash appears c/w insect bites.  No concerning sx's for infectious or viral process.  Pt agrees to sx treatment with benadryl and prednisone.  Pt appears stable for d/c and agrees to plan    Katerra Ingman L. Trisha Mangle, PA-C 06/25/14 1953

## 2014-06-26 NOTE — ED Provider Notes (Signed)
Medical screening examination/treatment/procedure(s) were performed by non-physician practitioner and as supervising physician I was immediately available for consultation/collaboration.   EKG Interpretation None        Tommie Dejoseph L Satchel Heidinger, MD 06/26/14 1354 

## 2014-08-02 ENCOUNTER — Encounter (HOSPITAL_COMMUNITY): Payer: Self-pay | Admitting: Emergency Medicine

## 2014-08-02 ENCOUNTER — Emergency Department (HOSPITAL_COMMUNITY)
Admission: EM | Admit: 2014-08-02 | Discharge: 2014-08-02 | Disposition: A | Discharge status: Home / Self Care | Payer: Managed Care, Other (non HMO) | Attending: Emergency Medicine | Admitting: Emergency Medicine

## 2014-08-02 DIAGNOSIS — Z87891 Personal history of nicotine dependence: Secondary | ICD-10-CM | POA: Insufficient documentation

## 2014-08-02 DIAGNOSIS — N760 Acute vaginitis: Secondary | ICD-10-CM | POA: Diagnosis not present

## 2014-08-02 DIAGNOSIS — Z792 Long term (current) use of antibiotics: Secondary | ICD-10-CM | POA: Insufficient documentation

## 2014-08-02 DIAGNOSIS — N739 Female pelvic inflammatory disease, unspecified: Secondary | ICD-10-CM | POA: Diagnosis not present

## 2014-08-02 DIAGNOSIS — Z3202 Encounter for pregnancy test, result negative: Secondary | ICD-10-CM | POA: Diagnosis not present

## 2014-08-02 DIAGNOSIS — J45909 Unspecified asthma, uncomplicated: Secondary | ICD-10-CM | POA: Insufficient documentation

## 2014-08-02 DIAGNOSIS — Z7952 Long term (current) use of systemic steroids: Secondary | ICD-10-CM | POA: Diagnosis not present

## 2014-08-02 DIAGNOSIS — Z79899 Other long term (current) drug therapy: Secondary | ICD-10-CM | POA: Diagnosis not present

## 2014-08-02 DIAGNOSIS — B9689 Other specified bacterial agents as the cause of diseases classified elsewhere: Secondary | ICD-10-CM

## 2014-08-02 DIAGNOSIS — R109 Unspecified abdominal pain: Secondary | ICD-10-CM | POA: Diagnosis present

## 2014-08-02 LAB — WET PREP, GENITAL
Trich, Wet Prep: NONE SEEN
YEAST WET PREP: NONE SEEN

## 2014-08-02 LAB — URINE MICROSCOPIC-ADD ON

## 2014-08-02 LAB — URINALYSIS, ROUTINE W REFLEX MICROSCOPIC
BILIRUBIN URINE: NEGATIVE
Glucose, UA: NEGATIVE mg/dL
KETONES UR: NEGATIVE mg/dL
Leukocytes, UA: NEGATIVE
NITRITE: NEGATIVE
PH: 5.5 (ref 5.0–8.0)
PROTEIN: NEGATIVE mg/dL
Specific Gravity, Urine: >1.03 — ABNORMAL HIGH (ref 1.005–1.030)
Urobilinogen, UA: 0.2 mg/dL (ref 0.0–1.0)

## 2014-08-02 LAB — PREGNANCY, URINE: Preg Test, Ur: NEGATIVE

## 2014-08-02 MED ORDER — LIDOCAINE HCL (PF) 1 % IJ SOLN
INTRAMUSCULAR | Status: AC
  Administered 2014-08-02: 5 mL
  Filled 2014-08-02: qty 5

## 2014-08-02 MED ORDER — CEFTRIAXONE SODIUM 250 MG IJ SOLR
250.0000 mg | Freq: Once | INTRAMUSCULAR | Status: AC
  Administered 2014-08-02: 250 mg via INTRAMUSCULAR
  Filled 2014-08-02: qty 250

## 2014-08-02 MED ORDER — DOXYCYCLINE HYCLATE 100 MG PO CAPS: 100.0000 mg | ORAL_CAPSULE | Freq: Two times a day (BID) | ORAL | Status: DC

## 2014-08-02 MED ORDER — METRONIDAZOLE 500 MG PO TABS: 500.0000 mg | ORAL_TABLET | Freq: Two times a day (BID) | ORAL | Status: DC

## 2014-08-02 MED ORDER — AZITHROMYCIN 250 MG PO TABS
1000.0000 mg | ORAL_TABLET | Freq: Once | ORAL | Status: AC
  Administered 2014-08-02: 1000 mg via ORAL
  Filled 2014-08-02: qty 4

## 2014-08-02 MED ORDER — ONDANSETRON HCL 4 MG PO TABS: 4.0000 mg | ORAL_TABLET | Freq: Four times a day (QID) | ORAL | Status: DC

## 2014-08-02 NOTE — ED Notes (Signed)
Lower abd and suprapubic pain that started this am, states she also has noticed a little blood in urine.

## 2014-08-02 NOTE — ED Notes (Signed)
Patient ambulatory to restroom at this time to collect urine sample. 

## 2014-08-02 NOTE — ED Notes (Signed)
Patient states lower abdomin/superpubic pain that started today. Patient states "i think its my IUD" patient denies NV or fever. Patient states white discharge. A&OX4 no other needs voiced.

## 2014-08-02 NOTE — ED Provider Notes (Signed)
CSN: [id number]     Arrival date & time 08/02/14  0011 History  This chart was scribed for April Octave, MD by Murriel Hopper, ED Scribe. This patient was seen in room APA03/APA03 and the patient's care was started at 12:29 AM.    Chief Complaint  Patient presents with  . Abdominal Pain     (Consider location/radiation/quality/duration/timing/severity/associated sxs/prior Treatment) The history is provided by the patient. No language interpreter was used.    HPI Comments: April Montgomery is a 18 y.o. female who presents to the Emergency Department complaining of constant lower abdominal pain that started this morning. Pt believes that pain is caused from her IUD birth control. Pt has had occasional vaginal bleeding today, in addition to "white, gooey" vaginal discharge. Pt states her last menstrual period was a week ago. Pt also notes back pain but states that it is chronic. Pt denies dysuria, hematuria, vomiting, nausea, fever, or abdominal surgeries. Pt denies having and new, recent sexual contact. Pt has taken ibuprofen with no alleviation.   No PCP  Past Medical History  Diagnosis Date  . Asthma   . Asthma attack 01/15/2014   History reviewed. No pertinent past surgical history. No family history on file. History  Substance Use Topics  . Smoking status: Former Smoker    Types: Cigarettes  . Smokeless tobacco: Not on file  . Alcohol Use: No   OB History   Grav Para Term Preterm Abortions TAB SAB Ect Mult Living   1              Review of Systems  A complete 10 system review of systems was obtained and all systems are negative except as noted in the HPI and PMH.    Allergies  Review of patient's allergies indicates no known allergies.  Home Medications   Prior to Admission medications   Medication Sig Start Date End Date Taking? Authorizing Provider  acetaminophen (TYLENOL) 500 MG tablet Take 1,000 mg by mouth every 6 (six) hours as needed for mild pain.   Yes  Historical Provider, MD  albuterol (PROVENTIL HFA;VENTOLIN HFA) 108 (90 BASE) MCG/ACT inhaler Inhale 1-2 puffs into the lungs every 6 (six) hours as needed for wheezing or shortness of breath.   Yes Historical Provider, MD  albuterol (PROVENTIL) (2.5 MG/3ML) 0.083% nebulizer solution Take 2.5 mg by nebulization every 6 (six) hours as needed for wheezing or shortness of breath.   Yes Historical Provider, MD  diphenhydrAMINE (BENADRYL) 25 MG tablet Take 1 tablet (25 mg total) by mouth every 4 (four) hours as needed for itching. 06/23/14   Tammy L. Triplett, PA-C  doxycycline (VIBRAMYCIN) 100 MG capsule Take 1 capsule (100 mg total) by mouth 2 (two) times daily. 08/02/14   April Octave, MD  metroNIDAZOLE (FLAGYL) 500 MG tablet Take 1 tablet (500 mg total) by mouth 2 (two) times daily. 08/02/14   April Octave, MD  mometasone-formoterol (DULERA) 200-5 MCG/ACT AERO Inhale 2 puffs into the lungs 2 (two) times daily. 01/17/14   Renae Fickle, MD  nicotine (NICODERM CQ - DOSED IN MG/24 HOURS) 14 mg/24hr patch Place 1 patch (14 mg total) onto the skin daily. 01/17/14   Renae Fickle, MD  ondansetron (ZOFRAN) 4 MG tablet Take 1 tablet (4 mg total) by mouth every 6 (six) hours. 08/02/14   April Octave, MD  predniSONE (DELTASONE) 10 MG tablet Take 6 tablets day one, 5 tablets day two, 4 tablets day three, 3 tablets day four, 2 tablets day five, then  1 tablet day six 06/23/14   Tammy L. Triplett, PA-C  predniSONE (DELTASONE) 20 MG tablet Take 3 tabs daily x 3 days, then 2 tabs daily x 3 days, 1 tab daily x 3 days, 1/2 tab daily x 4 days, then stop 01/17/14   Renae FickleMackenzie Short, MD   BP 117/94  Pulse 78  Temp(Src) 97.8 F (36.6 C) (Oral)  Resp 18  Ht 5\' 2"  (1.575 m)  Wt 165 lb (74.844 kg)  BMI 30.17 kg/m2  SpO2 100%  LMP 07/25/2014  Breastfeeding? No Physical Exam  Nursing note and vitals reviewed. Constitutional: She is oriented to person, place, and time. She appears well-developed and well-nourished.  No distress.  HENT:  Head: Normocephalic and atraumatic.  Mouth/Throat: Oropharynx is clear and moist. No oropharyngeal exudate.  Eyes: Conjunctivae and EOM are normal. Pupils are equal, round, and reactive to light.  Neck: Normal range of motion. Neck supple.  No meningismus.  Cardiovascular: Normal rate, regular rhythm, normal heart sounds and intact distal pulses.   No murmur heard. Pulmonary/Chest: Effort normal and breath sounds normal. No respiratory distress.  Abdominal: Soft. There is tenderness in the suprapubic area. There is no rebound, no guarding, no CVA tenderness and no tenderness at McBurney's point.  Suprapubic tenderness No guarding, no rebound No pain at McBurney's point No CVA tenderness  Genitourinary: Vaginal discharge found.  Chaperone present. Normal external genitalia.  IUD string in place.  Yellow/white discharge with CMT.  No lateralizing adnexal tenderness  Musculoskeletal: Normal range of motion. She exhibits no edema and no tenderness.  Neurological: She is alert and oriented to person, place, and time. No cranial nerve deficit. She exhibits normal muscle tone. Coordination normal.  No ataxia on finger to nose bilaterally. No pronator drift. 5/5 strength throughout. CN 2-12 intact. Negative Romberg. Equal grip strength. Sensation intact. Gait is normal.   Skin: Skin is warm.  Psychiatric: She has a normal mood and affect. Her behavior is normal.    ED Course  Procedures (including critical care time)  DIAGNOSTIC STUDIES: Oxygen Saturation is 100% on RA, normal by my interpretation.    COORDINATION OF CARE: 12:34 AM Discussed treatment plan with pt at bedside and pt agreed to plan.   Labs Review Labs Reviewed  WET PREP, GENITAL - Abnormal; Notable for the following:    Clue Cells Wet Prep HPF POC FEW (*)    WBC, Wet Prep HPF POC MODERATE (*)    All other components within normal limits  URINALYSIS, ROUTINE W REFLEX MICROSCOPIC - Abnormal; Notable  for the following:    Specific Gravity, Urine >1.030 (*)    Hgb urine dipstick TRACE (*)    All other components within normal limits  URINE MICROSCOPIC-ADD ON - Abnormal; Notable for the following:    Squamous Epithelial / LPF MANY (*)    Bacteria, UA MANY (*)    All other components within normal limits  GC/CHLAMYDIA PROBE AMP  PREGNANCY, URINE    Imaging Review No results found.   EKG Interpretation None      MDM   Final diagnoses:  Pelvic inflammatory disease  Bacterial vaginosis   Suprapubic pain since this morning with blood in the urine and dysuria. No fever or vomiting. UA not convincing for infection and appears contaminated.  Pelvic exam with discharge and cervical motion tenderness. No lateralizing adnexal pain.  Doubt TOA or ovarian torsion. Patient treated for PID with Rocephin, azithromycin.  She'll be discharged with doxycycline as well as Flagyl for  bacterial vaginosis. Advised to have her sexual partners treated.  Follow up with PCP.  Return to the ED for new or worsening symptoms.  BP 117/94  Pulse 78  Temp(Src) 97.8 F (36.6 C) (Oral)  Resp 18  Ht 5\' 2"  (1.575 m)  Wt 165 lb (74.844 kg)  BMI 30.17 kg/m2  SpO2 100%  LMP 07/25/2014  Breastfeeding? No   I personally performed the services described in this documentation, which was scribed in my presence. The recorded information has been reviewed and is accurate.   April OctaveStephen Khaleb Broz, MD 08/02/14 [phone]

## 2014-08-02 NOTE — Discharge Instructions (Signed)
Bacterial Vaginosis °Bacterial vaginosis is a vaginal infection that occurs when the normal balance of bacteria in the vagina is disrupted. It results from an overgrowth of certain bacteria. This is the most common vaginal infection in women of childbearing age. Treatment is important to prevent complications, especially in pregnant women, as it can cause a premature delivery. °CAUSES  °Bacterial vaginosis is caused by an increase in harmful bacteria that are normally present in smaller amounts in the vagina. Several different kinds of bacteria can cause bacterial vaginosis. However, the reason that the condition develops is not fully understood. °RISK FACTORS °Certain activities or behaviors can put you at an increased risk of developing bacterial vaginosis, including: °· Having a new sex partner or multiple sex partners. °· Douching. °· Using an intrauterine device (IUD) for contraception. °Women do not get bacterial vaginosis from toilet seats, bedding, swimming pools, or contact with objects around them. °SIGNS AND SYMPTOMS  °Some women with bacterial vaginosis have no signs or symptoms. Common symptoms include: °· Grey vaginal discharge. °· A fishlike odor with discharge, especially after sexual intercourse. °· Itching or burning of the vagina and vulva. °· Burning or pain with urination. °DIAGNOSIS  °Your health care provider will take a medical history and examine the vagina for signs of bacterial vaginosis. A sample of vaginal fluid may be taken. Your health care provider will look at this sample under a microscope to check for bacteria and abnormal cells. A vaginal pH test may also be done.  °TREATMENT  °Bacterial vaginosis may be treated with antibiotic medicines. These may be given in the form of a pill or a vaginal cream. A second round of antibiotics may be prescribed if the condition comes back after treatment.  °HOME CARE INSTRUCTIONS  °· Only take over-the-counter or prescription medicines as  directed by your health care provider. °· If antibiotic medicine was prescribed, take it as directed. Make sure you finish it even if you start to feel better. °· Do not have sex until treatment is completed. °· Tell all sexual partners that you have a vaginal infection. They should see their health care provider and be treated if they have problems, such as a mild rash or itching. °· Practice safe sex by using condoms and only having one sex partner. °SEEK MEDICAL CARE IF:  °· Your symptoms are not improving after 3 days of treatment. °· You have increased discharge or pain. °· You have a fever. °MAKE SURE YOU:  °· Understand these instructions. °· Will watch your condition. °· Will get help right away if you are not doing well or get worse. °FOR MORE INFORMATION  °Centers for Disease Control and Prevention, Division of STD Prevention: www.cdc.gov/std °American Sexual Health Association (ASHA): www.ashastd.org  °Document Released: 09/28/2005 Document Revised: 07/19/2013 Document Reviewed: 05/10/2013 °ExitCare® Patient Information ©2015 [employer]. This information is not intended to replace advice given to you by your health care provider. Make sure you discuss any questions you have with your health care provider. ° °Pelvic Inflammatory Disease °Pelvic inflammatory disease (PID) refers to an infection in some or all of the female organs. The infection can be in the uterus, ovaries, fallopian tubes, or the surrounding tissues in the pelvis. PID can cause abdominal or pelvic pain that comes on suddenly (acute pelvic pain). PID is a serious infection because it can lead to lasting (chronic) pelvic pain or the inability to have children (infertile).  °CAUSES  °The infection is often caused by the normal bacteria found   in the vaginal tissues. PID may also be caused by an infection that is spread during sexual contact. PID can also occur following:   The birth of a baby.   A miscarriage.   An abortion.    Major pelvic surgery.   The use of an intrauterine device (IUD).   A sexual assault.  RISK FACTORS Certain factors can put a person at higher risk for PID, such as:  Being younger than 25 years.  Being sexually active at [place] age.  Usingnonbarrier contraception.  Havingmultiple sexual partners.  Having sex with someone who has symptoms of a genital infection.  Using oral contraception. Other times, certain behaviors can increase the possibility of getting PID, such as:  Having sex during your period.  Using a vaginal douche.  Having an intrauterine device (IUD) in place. SYMPTOMS   Abdominal or pelvic pain.   Fever.   Chills.   Abnormal vaginal discharge.  Abnormal uterine bleeding.   Unusual pain shortly after finishing your period. DIAGNOSIS  Your caregiver will choose some of the following methods to make a diagnosis, such as:   Performinga physical exam and history. A pelvic exam typically reveals a very tender uterus and surrounding pelvis.   Ordering laboratory tests including a pregnancy test, blood tests, and urine test.  Orderingcultures of the vagina and cervix to check for a sexually transmitted infection (STI).  Performing an ultrasound.   Performing a laparoscopic procedure to look inside the pelvis.  TREATMENT   Antibiotic medicines may be prescribed and taken by mouth.   Sexual partners may be treated when the infection is caused by a sexually transmitted disease (STD).   Hospitalization may be needed to give antibiotics intravenously.  Surgery may be needed, but this is rare. It may take weeks until you are completely well. If you are diagnosed with PID, you should also be checked for human immunodeficiency virus (HIV). HOME CARE INSTRUCTIONS   If given, take your antibiotics as directed. Finish the medicine even if you start to feel better.   Only take over-the-counter or prescription medicines for pain,  discomfort, or fever as directed by your caregiver.   Do not have sexual intercourse until treatment is completed or as directed by your caregiver. If PID is confirmed, your recent sexual partner(s) will need treatment.   Keep your follow-up appointments. SEEK MEDICAL CARE IF:   You have increased or abnormal vaginal discharge.   You need prescription medicine for your pain.   You vomit.   You cannot take your medicines.   Your partner has an STD.  SEEK IMMEDIATE MEDICAL CARE IF:   You have a fever.   You have increased abdominal or pelvic pain.   You have chills.   You have pain when you urinate.   You are not better after 72 hours following treatment.  MAKE SURE YOU:   Understand these instructions.  Will watch your condition.  Will get help right away if you are not doing well or get worse. Document Released: 09/28/2005 Document Revised: 01/23/2013 Document Reviewed: 09/24/2011 [facility] Patient Information 2015 [place], [place]. This information is not intended to replace advice given to you by your health care provider. Make sure you discuss any questions you have with your health care provider.

## 2014-08-02 NOTE — ED Notes (Signed)
Patient verbalizes understanding of discharge instructions, prescription medications, and follow up with GYN. Patient ambulatory out of department at this time with family member.

## 2014-08-03 LAB — GC/CHLAMYDIA PROBE AMP
CT Probe RNA: NEGATIVE
GC Probe RNA: POSITIVE — AB

## 2014-08-04 ENCOUNTER — Telehealth (HOSPITAL_BASED_OUTPATIENT_CLINIC_OR_DEPARTMENT_OTHER): Payer: Self-pay | Admitting: Emergency Medicine

## 2014-08-04 NOTE — Telephone Encounter (Signed)
+  Gonorrhea. Patient treated with Rocephin and Zithromax. [facility] faxed. 

## 2014-08-13 ENCOUNTER — Encounter (HOSPITAL_COMMUNITY): Payer: Self-pay | Admitting: Emergency Medicine

## 2014-10-19 ENCOUNTER — Emergency Department (HOSPITAL_COMMUNITY)
Admission: EM | Admit: 2014-10-19 | Discharge: 2014-10-19 | Disposition: A | Discharge status: Home / Self Care | Payer: Medicaid Other | Attending: Emergency Medicine | Admitting: Emergency Medicine

## 2014-10-19 ENCOUNTER — Encounter (HOSPITAL_COMMUNITY): Payer: Self-pay | Admitting: Emergency Medicine

## 2014-10-19 DIAGNOSIS — Z3202 Encounter for pregnancy test, result negative: Secondary | ICD-10-CM | POA: Diagnosis not present

## 2014-10-19 DIAGNOSIS — N938 Other specified abnormal uterine and vaginal bleeding: Secondary | ICD-10-CM | POA: Diagnosis not present

## 2014-10-19 DIAGNOSIS — N939 Abnormal uterine and vaginal bleeding, unspecified: Secondary | ICD-10-CM

## 2014-10-19 DIAGNOSIS — Z79899 Other long term (current) drug therapy: Secondary | ICD-10-CM | POA: Diagnosis not present

## 2014-10-19 DIAGNOSIS — J45909 Unspecified asthma, uncomplicated: Secondary | ICD-10-CM | POA: Insufficient documentation

## 2014-10-19 DIAGNOSIS — Z87891 Personal history of nicotine dependence: Secondary | ICD-10-CM | POA: Diagnosis not present

## 2014-10-19 LAB — POC URINE PREG, ED: Preg Test, Ur: NEGATIVE

## 2014-10-19 NOTE — ED Notes (Signed)
Not able to submit clean catch urine specimen to lab for U/A due to vaginal blood in specimen cup.

## 2014-10-19 NOTE — ED Notes (Signed)
Pt reports took home pregnancy test x2 months ago. Pt reports did not have a period last month but reports vaginal bleeding this morning. Pt reports bleeding is "heavier than my normal period." pt [facility] and had a negative pregnancy test but was told to follow-up this week to test for pregnancy again. Pt reports lower abdominal pressure but denies any recent v/d.

## 2014-10-19 NOTE — ED Provider Notes (Signed)
CSN: [id number]     Arrival date & time 10/19/14  1015 History  This chart was scribed for Joya Gaskins, MD by Haywood Pao, ED Scribe. The patient was seen in [facility] and the patient's care was started at 11:16 AM.  Chief Complaint  Patient presents with  . Vaginal Bleeding   Patient is a 19 y.o. female presenting with vaginal bleeding. The history is provided by the patient. No language interpreter was used.  Vaginal Bleeding Severity:  Mild Onset quality:  Sudden Duration:  2 hours Chronicity:  New Possible pregnancy: no   Relieved by:  None tried Worsened by:  Nothing tried Ineffective treatments:  None tried Associated symptoms: abdominal pain   Associated symptoms: no dysuria and no fever     HPI Comments: April Montgomery is a 19 y.o. femalewith a history of asthma who presents to the Emergency Department complaining of vaginal bleeding and lower abdominal pain rated as 6/10, onset this morning. She took a home pregnancy two weeks ago, it tested positive. [facility] and she had two urine tests and blood work. All were negative for pregnancy. Pt has nausea and vomiting 3 days ago as associated symptoms. Her LNMP was 2 months ago. She denies dysuria and fever.  Past Medical History  Diagnosis Date  . Asthma   . Asthma attack 01/15/2014   History  Substance Use Topics  . Smoking status: Former Smoker    Types: Cigarettes  . Smokeless tobacco: Not on file  . Alcohol Use: No   OB History    Gravida Para Term Preterm AB TAB SAB Ectopic Multiple Living   1              Review of Systems  Constitutional: Negative for fever.  Gastrointestinal: Positive for abdominal pain.  Genitourinary: Positive for vaginal bleeding. Negative for dysuria.  All other systems reviewed and are negative.  Allergies  Review of patient's allergies indicates no known allergies.  Home Medications   Prior to Admission medications   Medication Sig Start Date End  Date Taking? Authorizing Provider  acetaminophen (TYLENOL) 500 MG tablet Take 1,000 mg by mouth every 6 (six) hours as needed for mild pain.    Historical Provider, MD  albuterol (PROVENTIL HFA;VENTOLIN HFA) 108 (90 BASE) MCG/ACT inhaler Inhale 1-2 puffs into the lungs every 6 (six) hours as needed for wheezing or shortness of breath.    Historical Provider, MD  albuterol (PROVENTIL) (2.5 MG/3ML) 0.083% nebulizer solution Take 2.5 mg by nebulization every 6 (six) hours as needed for wheezing or shortness of breath.    Historical Provider, MD  mometasone-formoterol (DULERA) 200-5 MCG/ACT AERO Inhale 2 puffs into the lungs 2 (two) times daily. 01/17/14   Renae Fickle, MD   BP 134/83 mmHg  Pulse 71  Temp(Src) 98.3 F (36.8 C) (Temporal)  Resp 18  Ht  (1.575 m)  Wt 151 lb (68.493 kg)  BMI 27.61 kg/m2  SpO2 100%  LMP 08/15/2014 Physical Exam  Nursing note and vitals reviewed.   CONSTITUTIONAL: Well developed/well nourished HEAD: Normocephalic/atraumatic EYES: EOMI/PERRL ENMT: Mucous membranes moist NECK: supple no meningeal signs SPINE/BACK:entire spine nontender CV: S1/S2 noted, no murmurs/rubs/gallops noted LUNGS: Lungs are clear to auscultation bilaterally, no apparent distress ABDOMEN: soft, nontender, no rebound or guarding, bowel sounds noted throughout abdomen GU:no cva tenderness NEURO: Pt is awake/alert/appropriate, moves all extremitiesx4.  No facial droop.   EXTREMITIES: pulses normal/equal, full ROM SKIN: warm, color normal PSYCH:  no abnormalities of mood noted, alert and oriented to situation  ED Course  Procedures  DIAGNOSTIC STUDIES: Oxygen Saturation is 100% on room air, normal by my interpretation.    COORDINATION OF CARE: 11:21 AM Discussed treatment plan with pt at bedside and pt agreed to plan.  Labs Review Labs Reviewed  POC URINE PREG, ED   Pt well appearing, no distress When told her pregnancy test was negative she requested discharge home  without any further testing/examination   MDM   Final diagnoses:  Vaginal bleeding     Nursing notes including past medical history and social history reviewed and considered in documentation Labs/vital reviewed myself and considered during evaluation   I personally performed the services described in this documentation, which was scribed in my presence. The recorded information has been reviewed and is accurate.      Joya Gaskinsonald W Edona Schreffler, MD 10/19/14 1426

## 2014-11-29 ENCOUNTER — Encounter (HOSPITAL_COMMUNITY): Payer: Self-pay

## 2014-11-29 ENCOUNTER — Emergency Department (HOSPITAL_COMMUNITY): Payer: Medicaid Other

## 2014-11-29 ENCOUNTER — Emergency Department (HOSPITAL_COMMUNITY)
Admission: EM | Admit: 2014-11-29 | Discharge: 2014-11-29 | Discharge status: Against Medical Advice | Payer: Medicaid Other | Attending: Emergency Medicine | Admitting: Emergency Medicine

## 2014-11-29 DIAGNOSIS — Z7951 Long term (current) use of inhaled steroids: Secondary | ICD-10-CM | POA: Diagnosis not present

## 2014-11-29 DIAGNOSIS — J45901 Unspecified asthma with (acute) exacerbation: Secondary | ICD-10-CM

## 2014-11-29 DIAGNOSIS — Z79899 Other long term (current) drug therapy: Secondary | ICD-10-CM | POA: Insufficient documentation

## 2014-11-29 DIAGNOSIS — R0602 Shortness of breath: Secondary | ICD-10-CM | POA: Diagnosis present

## 2014-11-29 DIAGNOSIS — Z87891 Personal history of nicotine dependence: Secondary | ICD-10-CM | POA: Diagnosis not present

## 2014-11-29 MED ORDER — PREDNISONE 50 MG PO TABS
60.0000 mg | ORAL_TABLET | Freq: Once | ORAL | Status: AC
  Administered 2014-11-29: 60 mg via ORAL
  Filled 2014-11-29 (×2): qty 1

## 2014-11-29 MED ORDER — IPRATROPIUM-ALBUTEROL 0.5-2.5 (3) MG/3ML IN SOLN
3.0000 mL | Freq: Once | RESPIRATORY_TRACT | Status: AC
  Administered 2014-11-29: 3 mL via RESPIRATORY_TRACT
  Filled 2014-11-29: qty 3

## 2014-11-29 MED ORDER — PREDNISONE 50 MG PO TABS: ORAL_TABLET | ORAL | Status: DC

## 2014-11-29 MED ORDER — ALBUTEROL SULFATE HFA 108 (90 BASE) MCG/ACT IN AERS: 1.0000 | INHALATION_SPRAY | Freq: Four times a day (QID) | RESPIRATORY_TRACT | Status: AC | PRN

## 2014-11-29 NOTE — ED Notes (Signed)
RT currently at bedside

## 2014-11-29 NOTE — ED Notes (Signed)
Went into room and pt not found. Gown found in the room. Pt not found in waiting area or outside.

## 2014-11-29 NOTE — ED Provider Notes (Signed)
CSN: [id number]     Arrival date & time 11/29/14  [id number] History   First MD Initiated Contact with Patient 11/29/14 [id number]     Chief Complaint  Patient presents with  . Shortness of Breath     (Consider location/radiation/quality/duration/timing/severity/associated sxs/prior Treatment) The history is provided by the patient.   April Montgomery is a 19 y.o. female presenting with asthma exacerbation since yesterday. She reports having a cold for the past few including nasal congestion, clear rhinorrhea, post nasal drip and sore throat along with occasional nonproductive cough.  She woke yesterday with increased sob, cough and wheezing along with discomfort in her right upper back worsened with cough. She has run out of her albuterol mdi and her nebulizer medicines.  She denies fevers or chills, nausea or vomiting, chest pain or palpitations.  She has found no alleviators.   Past Medical History  Diagnosis Date  . Asthma   . Asthma attack 01/15/2014   History reviewed. No pertinent past surgical history. No family history on file. History  Substance Use Topics  . Smoking status: Former Smoker    Types: Cigarettes  . Smokeless tobacco: Not on file  . Alcohol Use: No   OB History    Gravida Para Term Preterm AB TAB SAB Ectopic Multiple Living   1              Review of Systems  Constitutional: Negative for fever and chills.  HENT: Positive for congestion, postnasal drip, rhinorrhea and sore throat.   Eyes: Negative.   Respiratory: Positive for cough, chest tightness, shortness of breath and wheezing.   Cardiovascular: Negative for chest pain.  Gastrointestinal: Negative for nausea and abdominal pain.  Genitourinary: Negative.   Musculoskeletal: Negative for joint swelling, arthralgias and neck pain.  Skin: Negative.  Negative for rash and wound.  Neurological: Negative for dizziness, weakness, light-headedness, numbness and headaches.  Psychiatric/Behavioral: Negative.        Allergies  Review of patient's allergies indicates no known allergies.  Home Medications   Prior to Admission medications   Medication Sig Start Date End Date Taking? Authorizing Provider  acetaminophen (TYLENOL) 500 MG tablet Take 1,000 mg by mouth every 6 (six) hours as needed for mild pain.    Historical Provider, MD  albuterol (PROVENTIL HFA;VENTOLIN HFA) 108 (90 BASE) MCG/ACT inhaler Inhale 1-2 puffs into the lungs every 6 (six) hours as needed for wheezing or shortness of breath. 11/29/14   Burgess AmorJulie Honest Safranek, PA-C  mometasone-formoterol (DULERA) 200-5 MCG/ACT AERO Inhale 2 puffs into the lungs 2 (two) times daily. 01/17/14   Renae FickleMackenzie Short, MD  predniSONE (DELTASONE) 50 MG tablet 1 tab daily for 4 days 11/29/14   Burgess AmorJulie Violanda Bobeck, PA-C   BP 125/67 mmHg  Pulse 75  Temp(Src) 98.1 F (36.7 C) (Oral)  Resp 18  SpO2 100%  LMP 11/11/2014 Physical Exam  Constitutional: She appears well-developed and well-nourished.  HENT:  Head: Normocephalic and atraumatic.  Eyes: Conjunctivae are normal.  Neck: Normal range of motion.  Cardiovascular: Normal rate, regular rhythm, normal heart sounds and intact distal pulses.   Pulmonary/Chest: Effort normal. She has decreased breath sounds. She has wheezes. She has no rhonchi. She has no rales. She exhibits no tenderness.  Expiratory wheeze throughout all lung fields with prolonged respirations. ttp right upper back, no midline pain.    Abdominal: Soft. Bowel sounds are normal. There is no tenderness.  Musculoskeletal: Normal range of motion. She exhibits no edema.  Neurological: She is alert.  Skin:  Skin is warm and dry.  Psychiatric: She has a normal mood and affect.  Nursing note and vitals reviewed.   ED Course  Procedures (including critical care time) Labs Review Labs Reviewed - No data to display  Imaging Review Dg Chest 2 View  11/29/2014   CLINICAL DATA:  Cough and shortness of breath with chest pain  EXAM: CHEST  2 VIEW  COMPARISON:   01/15/2014  FINDINGS: The heart size and mediastinal contours are within normal limits. Both lungs are clear. The visualized skeletal structures are unremarkable.  IMPRESSION: No active cardiopulmonary disease.   Electronically Signed   By: Alcide Clever M.D.   On: 11/29/2014 10:48     EKG Interpretation None      MDM   Final diagnoses:  Shortness of breath  Asthma exacerbation    Pt received albuterol and atrovent neb tx, prednisone.  Awaiting Cxr results when informed by RN that patient had eloped without advising her.  Had completed vital signs about 15 minutes prior to discovering she was gone and RN noted no respiratory distress at that time (had received her neb tx and prednisone).  Prednisone pulse dosing along with albuterol MDI e-scribed to patient's pharmacy, advised dept secretary to notify patient that these medications have been called in for her.    Burgess Amor, PA-C 11/29/14 2112  Audree Camel, MD 12/06/14 [phone]

## 2014-11-29 NOTE — ED Notes (Signed)
Pt reports a few days ago had cold symptoms but started having wheezing yesterday.  Reports is out of her albuterol neb and inhaler.

## 2014-11-29 NOTE — ED Notes (Signed)
Respiratory therapy paged for breathing treatment.

## 2020-09-05 ENCOUNTER — Observation Stay (HOSPITAL_COMMUNITY)
Admission: AD | Admit: 2020-09-05 | Discharge: 2020-09-06 | Disposition: A | Discharge status: Home / Self Care | Payer: Self-pay | Source: Other Acute Inpatient Hospital | Attending: Family Medicine | Admitting: Family Medicine

## 2020-09-05 ENCOUNTER — Encounter (HOSPITAL_COMMUNITY): Payer: Self-pay | Admitting: Internal Medicine

## 2020-09-05 ENCOUNTER — Other Ambulatory Visit: Payer: Self-pay

## 2020-09-05 DIAGNOSIS — J45901 Unspecified asthma with (acute) exacerbation: Secondary | ICD-10-CM | POA: Insufficient documentation

## 2020-09-05 DIAGNOSIS — J4521 Mild intermittent asthma with (acute) exacerbation: Secondary | ICD-10-CM | POA: Diagnosis present

## 2020-09-05 DIAGNOSIS — B348 Other viral infections of unspecified site: Secondary | ICD-10-CM | POA: Diagnosis present

## 2020-09-05 DIAGNOSIS — R079 Chest pain, unspecified: Secondary | ICD-10-CM | POA: Diagnosis present

## 2020-09-05 DIAGNOSIS — Z20822 Contact with and (suspected) exposure to covid-19: Secondary | ICD-10-CM | POA: Insufficient documentation

## 2020-09-05 DIAGNOSIS — F17210 Nicotine dependence, cigarettes, uncomplicated: Secondary | ICD-10-CM | POA: Diagnosis present

## 2020-09-05 DIAGNOSIS — J982 Interstitial emphysema: Principal | ICD-10-CM | POA: Diagnosis present

## 2020-09-05 DIAGNOSIS — Z87891 Personal history of nicotine dependence: Secondary | ICD-10-CM | POA: Insufficient documentation

## 2020-09-05 MED ORDER — METHYLPREDNISOLONE SODIUM SUCC 125 MG IJ SOLR
60.0000 mg | Freq: Once | INTRAMUSCULAR | Status: AC
  Administered 2020-09-06: 60 mg via INTRAVENOUS
  Filled 2020-09-05: qty 2

## 2020-09-05 MED ORDER — ONDANSETRON HCL 4 MG/2ML IJ SOLN: 4.0000 mg | Freq: Four times a day (QID) | INTRAMUSCULAR | Status: DC | PRN

## 2020-09-05 MED ORDER — ALBUTEROL SULFATE HFA 108 (90 BASE) MCG/ACT IN AERS
2.0000 | INHALATION_SPRAY | RESPIRATORY_TRACT | Status: DC | PRN
  Filled 2020-09-05: qty 6.7

## 2020-09-05 MED ORDER — ALBUTEROL SULFATE HFA 108 (90 BASE) MCG/ACT IN AERS
2.0000 | INHALATION_SPRAY | Freq: Four times a day (QID) | RESPIRATORY_TRACT | Status: DC
  Administered 2020-09-06 (×3): 2 via RESPIRATORY_TRACT
  Filled 2020-09-05: qty 6.7

## 2020-09-05 MED ORDER — KETOROLAC TROMETHAMINE 30 MG/ML IJ SOLN: 30.0000 mg | Freq: Four times a day (QID) | INTRAMUSCULAR | Status: DC | PRN

## 2020-09-05 MED ORDER — ENOXAPARIN SODIUM 40 MG/0.4ML ~~LOC~~ SOLN
40.0000 mg | SUBCUTANEOUS | Status: DC
  Administered 2020-09-06: 40 mg via SUBCUTANEOUS
  Filled 2020-09-05: qty 0.4

## 2020-09-05 MED ORDER — ACETAMINOPHEN 650 MG RE SUPP: 650.0000 mg | Freq: Four times a day (QID) | RECTAL | Status: DC | PRN

## 2020-09-05 MED ORDER — ONDANSETRON HCL 4 MG PO TABS: 4.0000 mg | ORAL_TABLET | Freq: Four times a day (QID) | ORAL | Status: DC | PRN

## 2020-09-05 MED ORDER — ACETAMINOPHEN 325 MG PO TABS: 650.0000 mg | ORAL_TABLET | Freq: Four times a day (QID) | ORAL | Status: DC | PRN

## 2020-09-05 MED ORDER — PREDNISONE 20 MG PO TABS
50.0000 mg | ORAL_TABLET | ORAL | Status: DC
  Administered 2020-09-06: 50 mg via ORAL
  Filled 2020-09-05: qty 2

## 2020-09-05 MED ORDER — POLYETHYLENE GLYCOL 3350 17 G PO PACK: 17.0000 g | PACK | Freq: Every day | ORAL | Status: DC | PRN

## 2020-09-05 NOTE — H&P (Signed)
History and Physical    April Montgomery KZS:[id number] DOB: 02-27-1996 DOA: 09/05/2020  PCP: Patient, No Pcp Per  Patient coming from: [employer], maintenance from [facility]   Chief Complaint: chest pain   HPI:    24 year old female with past medical history of asthma, nicotine dependence who presents to [facility] as a incoming transfer from [facility] for shortness of breath and chest pain with diagnosis at originating facility of pneumomediastinum.  Patient explains that approximately 1 week ago she began to develop shortness of breath.  Initially this shortness of breath was mild in intensity but progressively became more and more severe over the next several days.  Breath is worse with exertion and improved with rest.  Shortness of breath is associated with wheezing and cough productive of yellow sputum.  Patient states that she frequently used her nebulizers at home which resulted in minimal improvement.  Patient denies any fevers, sick contacts, leg swelling, recent travel or confirmed contact with COVID-19 infection.  Patient's symptoms continue to worsen until approximately located in the superior portion of the sternal region.  Chest discomfort is 7 out of 10 in intensity and not exacerbated with movement deep inspiration or cough.  This chest discomfort persisted over the following day into the patient eventually presented on 11/24 to [facility] emergency department for evaluation.  Upon evaluation, patient was felt to be suffering from an asthma exacerbation.  CT imaging of the chest performed on 11/24 revealed a small pneumomediastinum and therefore the patient was admitted to the hospital.  Unfortunately, within several hours of admission the patient left AGAINST MEDICAL ADVICE before acquiring a medical bed.  Patient states that she went home because she "felt better."  Patient states that shortly after arriving home and attempting to sleep her chest pain began to  worsen again and therefore she presented once again to [facility] emergency department for evaluation.  Repeat chest x-ray performed on 11/25 revealed an increase in the size of pneumomediastinum.  Respiratory viral panel revealed parainfluenza 4 infection.  Case was discussed with CT surgery while in the [facility] emergency department and medical management was recommended.  Due to lack of beds, patient has been accepted to [facility] medical floor for continued medical management and CT surgery consultation.  Review of Systems:   Review of Systems  Respiratory: Positive for cough, shortness of breath and wheezing.   Cardiovascular: Positive for chest pain.  All other systems reviewed and are negative.   Past Medical History:  Diagnosis Date  . Asthma   . Asthma attack 01/15/2014    History reviewed. No pertinent surgical history.   reports that she has been smoking cigarettes. She has a 5.00 pack-year smoking history. She has never used smokeless tobacco. She reports that she does not drink alcohol and does not use drugs.  No Known Allergies  Family History  Problem Relation Age of Onset  . Heart disease Mother   . Heart disease Father   . Asthma Son      Prior to Admission medications   Medication Sig Start Date End Date Taking? Authorizing Provider  acetaminophen (TYLENOL) 500 MG tablet Take 1,000 mg by mouth every 6 (six) hours as needed for mild pain.    [provider]  albuterol (PROVENTIL HFA;VENTOLIN HFA) 108 (90 BASE) MCG/ACT inhaler Inhale 1-2 puffs into the lungs every 6 (six) hours as needed for wheezing or shortness of breath. 11/29/14   Burgess Amor, PA-C  mometasone-formoterol [facility])  200-5 MCG/ACT AERO Inhale 2 puffs into the lungs 2 (two) times daily. 01/17/14   Renae Fickle, MD  predniSONE (DELTASONE) 50 MG tablet 1 tab daily for 4 days 11/29/14   Burgess Amor, PA-C    Physical Exam: Vitals:   09/05/20 2245  BP: 139/88  Pulse: 78    Resp: 18  Temp: 98.8 F (37.1 C)  TempSrc: Oral  SpO2: 95%   Constitutional: Acute alert and oriented x3, no associated distress.   Skin: no rashes, no lesions, good skin turgor noted. Eyes: Pupils are equally reactive to light.  No evidence of scleral icterus or conjunctival pallor.  ENMT: Moist mucous membranes noted.  Posterior pharynx clear of any exudate or lesions.   Neck: normal, supple, no masses, no thyromegaly.  No evidence of jugular venous distension.   Respiratory: Notable expiratory wheezing heard in all fields.  Normal inspiratory to expiratory ratio.  No evidence of rales.  Normal respiratory effort. No accessory muscle use.  Cardiovascular: Regular rate and rhythm, no murmurs / rubs / gallops. No extremity edema. 2+ pedal pulses. No carotid bruits.  Chest: Mild superior chest wall tenderness without evidence of crepitus or deformity.  Back:   Nontender without crepitus or deformity. Abdomen: Abdomen is soft and nontender.  No evidence of intra-abdominal masses.  Positive bowel sounds noted in all quadrants.   Musculoskeletal: No joint deformity upper and lower extremities. Good ROM, no contractures. Normal muscle tone.  Neurologic: CN 2-12 grossly intact. Sensation intact.  Patient moving all 4 extremities spontaneously.  Patient is following all commands.  Patient is responsive to verbal stimuli.   Psychiatric: Patient exhibits normal mood with appropriate affect.  Patient seems to possess insight as to their current situation.     Labs on Admission: I have personally reviewed following labs and imaging studies -   CBC: No results for input(s): WBC, NEUTROABS, HGB, HCT, MCV, PLT in the last 168 hours. Basic Metabolic Panel: No results for input(s): NA, K, CL, CO2, GLUCOSE, BUN, CREATININE, CALCIUM, MG, PHOS in the last 168 hours. GFR: CrCl cannot be calculated (Patient's most recent lab result is older than the maximum 21 days allowed.). Liver Function Tests: No  results for input(s): AST, ALT, ALKPHOS, BILITOT, PROT, ALBUMIN in the last 168 hours. No results for input(s): LIPASE, AMYLASE in the last 168 hours. No results for input(s): AMMONIA in the last 168 hours. Coagulation Profile: No results for input(s): INR, PROTIME in the last 168 hours. Cardiac Enzymes: No results for input(s): CKTOTAL, CKMB, CKMBINDEX, TROPONINI in the last 168 hours. BNP (last 3 results) No results for input(s): PROBNP in the last 8760 hours. HbA1C: No results for input(s): HGBA1C in the last 72 hours. CBG: No results for input(s): GLUCAP in the last 168 hours. Lipid Profile: No results for input(s): CHOL, HDL, LDLCALC, TRIG, CHOLHDL, LDLDIRECT in the last 72 hours. Thyroid Function Tests: No results for input(s): TSH, T4TOTAL, FREET4, T3FREE, THYROIDAB in the last 72 hours. Anemia Panel: No results for input(s): VITAMINB12, FOLATE, FERRITIN, TIBC, IRON, RETICCTPCT in the last 72 hours. Urine analysis:    Component Value Date/Time   COLORURINE YELLOW 08/02/2014 0025   APPEARANCEUR CLEAR 08/02/2014 0025   LABSPEC >1.030 (H) 08/02/2014 0025   PHURINE 5.5 08/02/2014 0025   GLUCOSEU NEGATIVE 08/02/2014 0025   HGBUR TRACE (A) 08/02/2014 0025   BILIRUBINUR NEGATIVE 08/02/2014 0025   KETONESUR NEGATIVE 08/02/2014 0025   PROTEINUR NEGATIVE 08/02/2014 0025   UROBILINOGEN 0.2 08/02/2014 0025   NITRITE  NEGATIVE 08/02/2014 0025   LEUKOCYTESUR NEGATIVE 08/02/2014 0025    Radiological Exams on Admission - Personally Reviewed: No results found.  Telemetry: Personally reviewed.  Rhythm is normal sinus rhythm with heart rate of 85 bpm.  No dynamic ST segment changes appreciated.  Assessment/Plan Principal Problem:   Mild intermittent asthma with acute exacerbation  Patient exhibiting 1 week history of progressively worsening dyspnea, wheezing and slightly productive cough  Etiology is likely parainfluenza infection noted on respiratory viral panel performed at [facility].  Presentation is complicated by development of pneumomediastinum  Patient is currently not in respiratory distress, hemodynamically stable and is not hypoxic.  Provided patient with systemic steroids and scheduled bronchodilator therapy  Obtaining serial chest x-rays considering concurrent pneumomediastinum  Monitoring patient in progressive unit  Per accepting provider, CT surgery was already notified about patient prior to arrival.  We will formally contact CT surgery in the morning for consultation although likely conservative management is sufficient.  Active Problems:   Pneumomediastinum (HCC)   Conservative management  Likely secondary to asthma exacerbation due to parainfluenza viral infection  CT surgery consultation tomorrow  Serial chest x-rays    Parainfluenza infection   Likely the etiology of the patient's asthma exacerbation  Treating current asthma exacerbation as mentioned above    Chest pain   Likely musculoskeletal or pleuritic  As needed Toradol for moderate or severe pain  Obtaining EKG and troponin   Monitoring patient on telemetry    Nicotine dependence, cigarettes, uncomplicated   Counseling patient daily on cessation   Code Status:  Full code Family Communication: Deferred  Status is: Observation  The patient remains OBS appropriate and will d/c before 2 midnights.  Dispo: The patient is from: Transfer from another acute care facility              Anticipated d/c is to: Home              Anticipated d/c date is: 2 days              Patient currently is not medically stable to d/c.        Marinda Elk MD [facility] Pager [phone]  If 7PM-7AM, please contact night-coverage [email or web address] Use universal [place] password for that web site. If you do not have the password, please call the hospital operator.  09/05/2020, 11:53 PM

## 2020-09-06 ENCOUNTER — Observation Stay (HOSPITAL_COMMUNITY): Payer: Self-pay

## 2020-09-06 ENCOUNTER — Other Ambulatory Visit (HOSPITAL_COMMUNITY): Payer: Self-pay | Admitting: Family Medicine

## 2020-09-06 LAB — MAGNESIUM: Magnesium: 2.4 mg/dL (ref 1.7–2.4)

## 2020-09-06 LAB — CBC WITH DIFFERENTIAL/PLATELET
Abs Immature Granulocytes: 0.02 10*3/uL (ref 0.00–0.07)
Basophils Absolute: 0 10*3/uL (ref 0.0–0.1)
Basophils Relative: 0 %
Eosinophils Absolute: 0 10*3/uL (ref 0.0–0.5)
Eosinophils Relative: 0 %
HCT: 40.2 % (ref 36.0–46.0)
Hemoglobin: 13.9 g/dL (ref 12.0–15.0)
Immature Granulocytes: 0 %
Lymphocytes Relative: 15 %
Lymphs Abs: 1.2 10*3/uL (ref 0.7–4.0)
MCH: 33.9 pg (ref 26.0–34.0)
MCHC: 34.6 g/dL (ref 30.0–36.0)
MCV: 98 fL (ref 80.0–100.0)
Monocytes Absolute: 0.2 10*3/uL (ref 0.1–1.0)
Monocytes Relative: 2 %
Neutro Abs: 6.9 10*3/uL (ref 1.7–7.7)
Neutrophils Relative %: 83 %
Platelets: 300 10*3/uL (ref 150–400)
RBC: 4.1 MIL/uL (ref 3.87–5.11)
RDW: 12.1 % (ref 11.5–15.5)
WBC: 8.3 10*3/uL (ref 4.0–10.5)
nRBC: 0 % (ref 0.0–0.2)

## 2020-09-06 LAB — MRSA PCR SCREENING: MRSA by PCR: NEGATIVE

## 2020-09-06 LAB — COMPREHENSIVE METABOLIC PANEL
ALT: 30 U/L (ref 0–44)
AST: 32 U/L (ref 15–41)
Albumin: 4.2 g/dL (ref 3.5–5.0)
Alkaline Phosphatase: 40 U/L (ref 38–126)
Anion gap: 15 (ref 5–15)
BUN: 15 mg/dL (ref 6–20)
CO2: 20 mmol/L — ABNORMAL LOW (ref 22–32)
Calcium: 9.3 mg/dL (ref 8.9–10.3)
Chloride: 101 mmol/L (ref 98–111)
Creatinine, Ser: 1.11 mg/dL — ABNORMAL HIGH (ref 0.44–1.00)
GFR, Estimated: >60 mL/min (ref >60)
Glucose, Bld: 181 mg/dL — ABNORMAL HIGH (ref 70–99)
Potassium: 4 mmol/L (ref 3.5–5.1)
Sodium: 136 mmol/L (ref 135–145)
Total Bilirubin: 0.5 mg/dL (ref 0.3–1.2)
Total Protein: 7.5 g/dL (ref 6.5–8.1)

## 2020-09-06 LAB — RESP PANEL BY RT-PCR (FLU A&B, COVID) ARPGX2
Influenza A by PCR: NEGATIVE
Influenza B by PCR: NEGATIVE
SARS Coronavirus 2 by RT PCR: NEGATIVE

## 2020-09-06 LAB — HIV ANTIBODY (ROUTINE TESTING W REFLEX): HIV Screen 4th Generation wRfx: NONREACTIVE

## 2020-09-06 LAB — TROPONIN I (HIGH SENSITIVITY): Troponin I (High Sensitivity): <2 ng/L (ref <18)

## 2020-09-06 MED ORDER — HYDROCODONE-HOMATROPINE 5-1.5 MG/5ML PO SYRP
5.0000 mL | ORAL_SOLUTION | Freq: Four times a day (QID) | ORAL | 0 refills | Status: DC | PRN
Start: 2020-09-06 — End: 2020-09-06

## 2020-09-06 MED ORDER — HYDROCOD POLST-CPM POLST ER 10-8 MG/5ML PO SUER: 5.0000 mL | Freq: Two times a day (BID) | ORAL | 0 refills | Status: AC

## 2020-09-06 MED ORDER — ALBUTEROL SULFATE 1.25 MG/3ML IN NEBU: 1.0000 | INHALATION_SOLUTION | Freq: Four times a day (QID) | RESPIRATORY_TRACT | 12 refills | Status: DC | PRN

## 2020-09-06 MED ORDER — PREDNISONE 20 MG PO TABS: 40.0000 mg | ORAL_TABLET | Freq: Every day | ORAL | 0 refills | Status: DC

## 2020-09-06 MED ORDER — ALBUTEROL SULFATE (2.5 MG/3ML) 0.083% IN NEBU: 2.5000 mg | INHALATION_SOLUTION | RESPIRATORY_TRACT | 2 refills | Status: DC | PRN

## 2020-09-06 MED ORDER — BENZONATATE 100 MG PO CAPS
100.0000 mg | ORAL_CAPSULE | Freq: Three times a day (TID) | ORAL | Status: DC | PRN
  Administered 2020-09-06: 100 mg via ORAL
  Filled 2020-09-06: qty 1

## 2020-09-06 MED FILL — predniSONE 20 MG TABS: 20 | 5 days supply | Qty: 10 | Fill #0

## 2020-09-06 MED FILL — ALBUTEROL SUL 2.5 MG/3 ML S: (2.5 MG/3ML | 5 days supply | Qty: 90 | Fill #0

## 2020-09-06 MED FILL — BENZONATATE 200 MG CAPS: 200 | 42 days supply | Qty: 42 | Fill #0

## 2020-09-06 NOTE — Progress Notes (Signed)
CSW attempted to call [facility] but they are closed on Fridays.  Spoke with pt regarding calling Monday to initiate visit as new pt and she will follow up. Daleen Squibb, MSW, LCSW 11/26/20214:17 PM

## 2020-09-06 NOTE — Progress Notes (Signed)
Patient has medications and wheeled to discharge area via wheelchair by RN. No distress noted.  Picked up by father.

## 2020-09-06 NOTE — Discharge Instructions (Signed)
Pneumomediastinum  Pneumomediastinum is the presence of air in the mediastinum. This is the area of the body that is between the lungs and behind the breastbone. Mild cases of this condition may not cause problems. Severe cases can interfere with the normal functions of your heart and lungs. What are the causes? This condition happens when air leaks out of your lungs, airways, or intestines and into your mediastinum. This condition may be caused by:  Childbirth.  An injury to your chest, lung, intestine, esophagus, or abdomen.  Asthma.  Rapid ascent during scuba diving.  Use of a breathing machine (ventilator).  Inhaling or ingesting certain drugs or chemicals.  An infection in your face, neck, chest, or abdomen.  Extreme strain during coughing or vomiting.  Breathing an object into an airway. This condition can also occur without a cause. What are the signs or symptoms? Symptoms of this condition include:  Chest pain. The pain may run into your neck, shoulder, back, or arms.  Increased pain when you move, swallow, or take a deep breath.  Problems swallowing.  Problems speaking.  Changes in your voice.  Shortness of breath.  Fever.  Throat or jaw pain. Some people have no symptoms. This is often the case if the condition occurred on its own. How is this diagnosed? This condition may be diagnosed based on:  Your symptoms.  A physical exam.  Imaging tests, such as a chest X-ray or CT scan. How is this treated? Treatment depends on how severe the condition is and whether there are complications.  If your condition is mild, you may not need treatment. Your body may slowly reabsorb the air in your mediastinum. You will stay in the hospital for observation and get medicine for pain, if you have pain.  If your condition is severe, or if the air starts to put pressure on your heart or lungs, you may need: ? Treatment for the underlying cause. ? One or more of the  following procedures:  Needle aspiration. In this procedure, a needle is used to remove trapped air.  Chest tube placement. This may be done if your lung collapses.  Surgery. This may be done to repair a hole in your intestine or esophagus. Follow these instructions at home:   Until your health care provider says it is okay, avoid: ? Air travel. ? Scuba diving. ? High altitudes. ? Hard physical work. ? Exercise.  Avoid any movements that make you strain your muscles. Try not to cough, laugh hard, or lift anything heavy.  Do not use any products that contain nicotine or tobacco, such as cigarettes and e-cigarettes. If you need help quitting, ask your health care provider.  Do not use illegal drugs.  Take over-the-counter and prescription medicines only as told by your health care provider. Contact a health care provider if:  You have a fever. Get help right away if you have:  Worsening pain in your chest, neck, jaw, or arms.  Trouble breathing.  New problems with speaking or swallowing. Summary  Pneumomediastinum is the presence of air in the mediastinum. This is the area of the body that is between the lungs and behind the breastbone. This can happen if air leaks out of your lungs, airways, or intestines and into your mediastinum.  Symptoms may include chest, throat or jaw pain, increased pain when you move, swallow, or take a deep breath, changes in your voice, shortness of breath, and fever.  Treatment depends on how severe your condition is  and if there are complications. This information is not intended to replace advice given to you by your health care provider. Make sure you discuss any questions you have with your health care provider. Document Revised: 11/03/2017 Document Reviewed: 11/03/2017 Elsevier Patient Education  2020 [employer]  Asthma, Adult  Asthma is a long-term (chronic) condition in which the airways get tight and narrow. The airways are the  breathing passages that lead from the nose and mouth down into the lungs. A person with asthma will have times when symptoms get worse. These are called asthma attacks. They can cause coughing, whistling sounds when you breathe (wheezing), shortness of breath, and chest pain. They can make it hard to breathe. There is no cure for asthma, but medicines and lifestyle changes can help control it. There are many things that can bring on an asthma attack or make asthma symptoms worse (triggers). Common triggers include:  Mold.  Dust.  Cigarette smoke.  Cockroaches.  Things that can cause allergy symptoms (allergens). These include animal skin flakes (dander) and pollen from trees or grass.  Things that pollute the air. These may include household cleaners, wood smoke, smog, or chemical odors.  Cold air, weather changes, and wind.  Crying or laughing hard.  Stress.  Certain medicines or drugs.  Certain foods such as dried fruit, potato chips, and grape juice.  Infections, such as a cold or the flu.  Certain medical conditions or diseases.  Exercise or tiring activities. Asthma may be treated with medicines and by staying away from the things that cause asthma attacks. Types of medicines may include:  Controller medicines. These help prevent asthma symptoms. They are usually taken every day.  Fast-acting reliever or rescue medicines. These quickly relieve asthma symptoms. They are used as needed and provide short-term relief.  Allergy medicines if your attacks are brought on by allergens.  Medicines to help control the body's defense (immune) system. Follow these instructions at home: Avoiding triggers in your home  Change your heating and air conditioning filter often.  Limit your use of fireplaces and wood stoves.  Get rid of pests (such as roaches and mice) and their droppings.  Throw away plants if you see mold on them.  Clean your floors. Dust regularly. Use cleaning  products that do not smell.  Have someone vacuum when you are not home. Use a vacuum cleaner with a HEPA filter if possible.  Replace carpet with wood, tile, or vinyl flooring. Carpet can trap animal skin flakes and dust.  Use allergy-proof pillows, mattress covers, and box spring covers.  Wash bed sheets and blankets every week in hot water. Dry them in a dryer.  Keep your bedroom free of any triggers.  Avoid pets and keep windows closed when things that cause allergy symptoms are in the air.  Use blankets that are made of polyester or cotton.  Clean bathrooms and kitchens with bleach. If possible, have someone repaint the walls in these rooms with mold-resistant paint. Keep out of the rooms that are being cleaned and painted.  Wash your hands often with soap and water. If soap and water are not available, use hand sanitizer.  Do not allow anyone to smoke in your home. General instructions  Take over-the-counter and prescription medicines only as told by your doctor. ? Talk with your doctor if you have questions about how or when to take your medicines. ? Make note if you need to use your medicines more often than usual.  Do  not use any products that contain nicotine or tobacco, such as cigarettes and e-cigarettes. If you need help quitting, ask your doctor.  Stay away from secondhand smoke.  Avoid doing things outdoors when allergen counts are high and when air quality is low.  Wear a ski mask when doing outdoor activities in the winter. The mask should cover your nose and mouth. Exercise indoors on cold days if you can.  Warm up before you exercise. Take time to cool down after exercise.  Use a peak flow meter as told by your doctor. A peak flow meter is a tool that measures how well the lungs are working.  Keep track of the peak flow meter's readings. Write them down.  Follow your asthma action plan. This is a written plan for taking care of your asthma and treating your  attacks.  Make sure you get all the shots (vaccines) that your doctor recommends. Ask your doctor about a flu shot and a pneumonia shot.  Keep all follow-up visits as told by your doctor. This is important. Contact a doctor if:  You have wheezing, shortness of breath, or a cough even while taking medicine to prevent attacks.  The mucus you cough up (sputum) is thicker than usual.  The mucus you cough up changes from clear or white to yellow, green, gray, or bloody.  You have problems from the medicine you are taking, such as: ? A rash. ? Itching. ? Swelling. ? Trouble breathing.  You need reliever medicines more than 2-3 times a week.  Your peak flow reading is still at 50-79% of your personal best after following the action plan for 1 hour.  You have a fever. Get help right away if:  You seem to be worse and are not responding to medicine during an asthma attack.  You are short of breath even at rest.  You get short of breath when doing very little activity.  You have trouble eating, drinking, or talking.  You have chest pain or tightness.  You have a fast heartbeat.  Your lips or fingernails start to turn blue.  You are light-headed or dizzy, or you faint.  Your peak flow is less than 50% of your personal best.  You feel too tired to breathe normally. Summary  Asthma is a long-term (chronic) condition in which the airways get tight and narrow. An asthma attack can make it hard to breathe.  Asthma cannot be cured, but medicines and lifestyle changes can help control it.  Make sure you understand how to avoid triggers and how and when to use your medicines. This information is not intended to replace advice given to you by your health care provider. Make sure you discuss any questions you have with your health care provider. Document Revised: 12/01/2018 Document Reviewed: 11/02/2016 Elsevier Patient Education  2020 [employer].

## 2020-09-06 NOTE — Discharge Summary (Signed)
Physician Discharge Summary  April Montgomery RXV:[id number] DOB: 09-12-1996 DOA: 09/05/2020  PCP: Patient, No Pcp Per  Admit date: 09/05/2020 Discharge date: 09/06/2020  Admitted From: Home  Disposition:  Home   Recommendations for Outpatient Follow-up:  1. Follow up with [facility] free clinic in 1-2 weeks 2. Please obtain chest x-ray in 4 weeks to evaluate resolution of pneumomediastinum     Home Health: None Equipment/Devices: None  Discharge Condition: Good CODE STATUS: Full Diet recommendation: Regular  Brief/Interim Summary: April Montgomery is a 24 y.o F with history asthma, moderate persistent, active smoker who presented with shortness of breath and chest pain.  Few weeks ago patient started to develop shortness of breath, wheezing, and cough similar to her previous asthma exacerbations (a few times per year, treated with 1-2 courses of prednisone per year, never hospitalized).  She was seen in the ER, treated and discharged for asthma exacerbation, had persistent severe cough, and then she started to develop chest and neck pain, radiating to the sternal region.  Return to the hospital where CT imaging showed pneumomediastinum, she initially left AGAINST MEDICAL ADVICE.  Subsequently had worsening pain, and return to the ER.  RVP showed parainfluenza for infection.  Case was discussed with CT surgery while the patient was at [facility] ER, and medical management was recommended.  She was transferred here due to lack of bed space.         PRINCIPAL HOSPITAL DIAGNOSIS: Pneumomediastinum and asthma exacerbation    Discharge Diagnoses:  Asthma exacerbation Patient was treated with Solu-Medrol and bronchodilators.  Her symptoms improved, she was stable on room air, and had no shortness of breath with exertion.  She was discharged with 5 days prednisone, refill of her bronchodilators, and recommendation for PCP follow-up at the [facility] free clinic.  Smoking  cessation was strongly recommended, modalities were discussed.   Pneumomediastinum This was discussed again with cc surgery, who reiterated that this is managed on a purely medical basis, has negligible risk to progress, and with pain complications (infection) are clearly not present in this case.  No particular follow-up is necessary, unless the patient were to develop fever, chills, evidence of perforation.  Aggressive cough suppressants were prescribed, anticipatory guidance given, PCP follow-up recommended.          Discharge Instructions  Discharge Instructions    Discharge instructions   Complete by: As directed    From Dr. Maryfrances Bunnell: You were admitted for an asthma flare and "pneumomediastinum". This is a long word, but it refers to a problem where air leaks out of the lung, into the chest between the lungs and heart. It is painful, but not generally dangerous, because this is a closed space. Over the next few days, the hole will heal, and then the air will all be absorbed, probbaly over days to a few weeks.  The treatment is treatment of hte underlying problem: In your case asthma and excessive coughing  For the asthma: take prednisone 40 mg (two tabs) once daily for 5 days Use the albuterol in your nebulizer three times daily for the next week, then reduce to as needed use  ALSO, suppress coughing with the Tussionex cough syrup Take Tussionex twice daily for the next few days, then use as needed for cough Remember Tussionex is a opiate, so do not drive, keep away from children and dispose of excess as we talked about.   Take acetaminophen 1000 mg up to three times daily for pain You may also add ibuprofen  400 mg up to three times daily for pain as well, do not take ibuprofen for more than a week  If at any time, you have fever, OBVIOUSLY worsening pain, or worsening trouble breathing, you should come back to be evaluated   This problem has no absolutely necessary  follow up However, the asthma should be followed up with the [facility] (listed below in the To Do section) They can also schedule you for a chest x-ray in 4 weeks to document resolution   If you feel it is more reassuring to you, you may reach out to one of the chest specialists listed below for follow up as well Dr. Juanetta GoslingHawkins is a pulmonary specialist in [place] [facility]  You may try reaching out to them for follow up if you need   Increase activity slowly   Complete by: As directed      Allergies as of 09/06/2020   No Known Allergies     Medication List    STOP taking these medications   albuterol 1.25 MG/3ML nebulizer solution Commonly known as: ACCUNEB Replaced by: albuterol (2.5 MG/3ML) 0.083% nebulizer solution You also have another medication with the same name that you need to continue taking as instructed.   mometasone-formoterol 200-5 MCG/ACT Aero Commonly known as: DULERA     TAKE these medications   albuterol 108 (90 Base) MCG/ACT inhaler Commonly known as: VENTOLIN HFA Inhale 1-2 puffs into the lungs every 6 (six) hours as needed for wheezing or shortness of breath. What changed:   Another medication with the same name was added. Make sure you understand how and when to take each.  Another medication with the same name was removed. Continue taking this medication, and follow the directions you see here.   albuterol (2.5 MG/3ML) 0.083% nebulizer solution Commonly known as: PROVENTIL Take 3 mLs (2.5 mg total) by nebulization every 4 (four) hours as needed for wheezing or shortness of breath. What changed: You were already taking a medication with the same name, and this prescription was added. Make sure you understand how and when to take each. Replaces: albuterol 1.25 MG/3ML nebulizer solution   chlorpheniramine-HYDROcodone 10-8 MG/5ML Suer Commonly known as: Tussionex  Pennkinetic ER Take 5 mLs by mouth 2 (two) times daily.   predniSONE 20 MG tablet Commonly known as: DELTASONE Take 2 tablets (40 mg total) by mouth daily.       Follow-up Information    [facility] Schedule an appointment as soon as possible for a visit.   Why: Call for an appointment on Monday Contact information: [address] [place] [id number] [phone]       Kari BaarsHawkins, Edward, MD Follow up.   Specialty: Pulmonary Disease       [facility] Follow up.   Specialty: Cardiothoracic Surgery Contact information: [address], [address] [place] [id number] [phone]             No Known Allergies  Consultations:  CT surgery by phone   Procedures/Studies: DG Chest 2 View  Result Date: 09/06/2020 CLINICAL DATA:  24 year old female with asthma, pneumomediastinum. EXAM: CHEST - 2 VIEW COMPARISON:  Chest radiographs 09/05/2020 and earlier. FINDINGS: Lung volumes and mediastinal contours remain within normal limits. Visualized tracheal air column is within normal limits. No pneumothorax or pleural effusion. Lungs remain clear. Subcutaneous gas at the thoracic inlet  redemonstrated and increased since the time of the 09/04/2020 CT, but not significantly changed from yesterday. No osseous abnormality identified. Negative visible bowel gas pattern. IMPRESSION: 1. Thoracic inlet subcutaneous emphysema related to pneumomediastinum, not significantly changed from yesterday. 2. No pneumothorax or new cardiopulmonary abnormality. Electronically Signed   By: Odessa Fleming M.D.   On: 09/06/2020 06:11      Subjective: Patient has mild pain to the chest, no confusion, fever, malaise, chills, hemoptysis, vomiting.  Discharge Exam: Vitals:   09/06/20 0727 09/06/20 0839  BP:  126/80  Pulse: 72 72  Resp: 18 (!) 22  Temp:  98.6 F (37 C)  SpO2: 96%    Vitals:   09/06/20 0330  09/06/20 0727 09/06/20 0839 09/06/20 1055  BP: 115/82  126/80   Pulse: 65 72 72   Resp: 19 18 (!) 22   Temp: 98.2 F (36.8 C)  98.6 F (37 C)   TempSrc: Oral  Oral   SpO2: 98% 96%    Weight:    83.9 kg  Height:     (1.575 m)    General: Pt is alert, awake, not in acute distress Cardiovascular: RRR, nl S1-S2, no murmurs appreciated.   No LE edema.   Respiratory: Normal respiratory rate and rhythm.  Wheezing with expiration.  Skin crepitus when palpating the upper neck.   Abdominal: Abdomen soft and non-tender.  No distension or HSM.   Neuro/Psych: Strength symmetric in upper and lower extremities.  Judgment and insight appear normal.   The results of significant diagnostics from this hospitalization (including imaging, microbiology, ancillary and laboratory) are listed below for reference.     Microbiology: Recent Results (from the past 240 hour(s))  Resp Panel by RT-PCR (Flu A&B, Covid) Nasopharyngeal Swab     Status: None   Collection Time: 09/06/20 12:58 AM   Specimen: Nasopharyngeal Swab; Nasopharyngeal(NP) swabs in vial transport medium  Result Value Ref Range Status   SARS Coronavirus 2 by RT PCR NEGATIVE NEGATIVE Final    Comment: (NOTE) SARS-CoV-2 target nucleic acids are NOT DETECTED.  The SARS-CoV-2 RNA is generally detectable in upper respiratory specimens during the acute phase of infection. The lowest concentration of SARS-CoV-2 viral copies this assay can detect is 138 copies/mL. A negative result does not preclude SARS-Cov-2 infection and should not be used as the sole basis for treatment or other patient management decisions. A negative result may occur with  improper specimen collection/handling, submission of specimen other than nasopharyngeal swab, presence of viral mutation(s) within the areas targeted by this assay, and inadequate number of viral copies(<138 copies/mL). A negative result must be combined with clinical observations, patient history,  and epidemiological information. The expected result is Negative.  Fact Sheet for Patients:  BloggerCourse.com  Fact Sheet for Healthcare Providers:  SeriousBroker.it  This test is no t yet approved or cleared by the [place] FDA and  has been authorized for detection and/or diagnosis of SARS-CoV-2 by FDA under an Emergency Use Authorization (EUA). This EUA will remain  in effect (meaning this test can be used) for the duration of the COVID-19 declaration under Section 564(b)(1) of the Act, 21 U.S.C.section 360bbb-3(b)(1), unless the authorization is terminated  or revoked sooner.       Influenza A by PCR NEGATIVE NEGATIVE Final   Influenza B by PCR NEGATIVE NEGATIVE Final    Comment: (NOTE) The Xpert Xpress SARS-CoV-2/FLU/RSV plus assay is intended as an aid in the diagnosis of influenza from Nasopharyngeal swab specimens and  should not be used as a sole basis for treatment. Nasal washings and aspirates are unacceptable for Xpert Xpress SARS-CoV-2/FLU/RSV testing.  Fact Sheet for Patients: BloggerCourse.com  Fact Sheet for Healthcare Providers: SeriousBroker.it  This test is not yet approved or cleared by the [place] FDA and has been authorized for detection and/or diagnosis of SARS-CoV-2 by FDA under an Emergency Use Authorization (EUA). This EUA will remain in effect (meaning this test can be used) for the duration of the COVID-19 declaration under Section 564(b)(1) of the Act, 21 U.S.C. section 360bbb-3(b)(1), unless the authorization is terminated or revoked.  Performed at [facility], [address] [place], [place] [address]   MRSA PCR Screening     Status: None   Collection Time: 09/06/20 12:58 AM   Specimen: Nasopharyngeal  Result Value Ref Range Status   MRSA by PCR NEGATIVE NEGATIVE Final    Comment:        The GeneXpert MRSA Assay  (FDA approved for NASAL specimens only), is one component of a comprehensive MRSA colonization surveillance program. It is not intended to diagnose MRSA infection nor to guide or monitor treatment for MRSA infections. Performed at [facility], [address] [place], [place] [address]      Labs: BNP (last 3 results) No results for input(s): BNP in the last 8760 hours. Basic Metabolic Panel: Recent Labs  Lab 09/06/20 0028  NA 136  K 4.0  CL 101  CO2 20*  GLUCOSE 181*  BUN 15  CREATININE 1.11*  CALCIUM 9.3  MG 2.4   Liver Function Tests: Recent Labs  Lab 09/06/20 0028  AST 32  ALT 30  ALKPHOS 40  BILITOT 0.5  PROT 7.5  ALBUMIN 4.2   No results for input(s): LIPASE, AMYLASE in the last 168 hours. No results for input(s): AMMONIA in the last 168 hours. CBC: Recent Labs  Lab 09/06/20 0028  WBC 8.3  NEUTROABS 6.9  HGB 13.9  HCT 40.2  MCV 98.0  PLT 300   Cardiac Enzymes: No results for input(s): CKTOTAL, CKMB, CKMBINDEX, TROPONINI in the last 168 hours. BNP: Invalid input(s): POCBNP CBG: No results for input(s): GLUCAP in the last 168 hours. D-Dimer No results for input(s): DDIMER in the last 72 hours. Hgb A1c No results for input(s): HGBA1C in the last 72 hours. Lipid Profile No results for input(s): CHOL, HDL, LDLCALC, TRIG, CHOLHDL, LDLDIRECT in the last 72 hours. Thyroid function studies No results for input(s): TSH, T4TOTAL, T3FREE, THYROIDAB in the last 72 hours.  Invalid input(s): FREET3 Anemia work up No results for input(s): VITAMINB12, FOLATE, FERRITIN, TIBC, IRON, RETICCTPCT in the last 72 hours. Urinalysis    Component Value Date/Time   COLORURINE YELLOW 08/02/2014 0025   APPEARANCEUR CLEAR 08/02/2014 0025   LABSPEC >1.030 (H) 08/02/2014 0025   PHURINE 5.5 08/02/2014 0025   GLUCOSEU NEGATIVE 08/02/2014 0025   HGBUR TRACE (A) 08/02/2014 0025   BILIRUBINUR NEGATIVE 08/02/2014 0025   KETONESUR NEGATIVE 08/02/2014 0025    PROTEINUR NEGATIVE 08/02/2014 0025   UROBILINOGEN 0.2 08/02/2014 0025   NITRITE NEGATIVE 08/02/2014 0025   LEUKOCYTESUR NEGATIVE 08/02/2014 0025   Sepsis Labs Invalid input(s): PROCALCITONIN,  WBC,  LACTICIDVEN Microbiology Recent Results (from the past 240 hour(s))  Resp Panel by RT-PCR (Flu A&B, Covid) Nasopharyngeal Swab     Status: None   Collection Time: 09/06/20 12:58 AM   Specimen: Nasopharyngeal Swab; Nasopharyngeal(NP) swabs in vial transport medium  Result Value Ref Range Status  SARS Coronavirus 2 by RT PCR NEGATIVE NEGATIVE Final    Comment: (NOTE) SARS-CoV-2 target nucleic acids are NOT DETECTED.  The SARS-CoV-2 RNA is generally detectable in upper respiratory specimens during the acute phase of infection. The lowest concentration of SARS-CoV-2 viral copies this assay can detect is 138 copies/mL. A negative result does not preclude SARS-Cov-2 infection and should not be used as the sole basis for treatment or other patient management decisions. A negative result may occur with  improper specimen collection/handling, submission of specimen other than nasopharyngeal swab, presence of viral mutation(s) within the areas targeted by this assay, and inadequate number of viral copies(<138 copies/mL). A negative result must be combined with clinical observations, patient history, and epidemiological information. The expected result is Negative.  Fact Sheet for Patients:  BloggerCourse.com  Fact Sheet for Healthcare Providers:  SeriousBroker.it  This test is no t yet approved or cleared by the [place] FDA and  has been authorized for detection and/or diagnosis of SARS-CoV-2 by FDA under an Emergency Use Authorization (EUA). This EUA will remain  in effect (meaning this test can be used) for the duration of the COVID-19 declaration under Section 564(b)(1) of the Act, 21 U.S.C.section 360bbb-3(b)(1), unless the  authorization is terminated  or revoked sooner.       Influenza A by PCR NEGATIVE NEGATIVE Final   Influenza B by PCR NEGATIVE NEGATIVE Final    Comment: (NOTE) The Xpert Xpress SARS-CoV-2/FLU/RSV plus assay is intended as an aid in the diagnosis of influenza from Nasopharyngeal swab specimens and should not be used as a sole basis for treatment. Nasal washings and aspirates are unacceptable for Xpert Xpress SARS-CoV-2/FLU/RSV testing.  Fact Sheet for Patients: BloggerCourse.com  Fact Sheet for Healthcare Providers: SeriousBroker.it  This test is not yet approved or cleared by the [place] FDA and has been authorized for detection and/or diagnosis of SARS-CoV-2 by FDA under an Emergency Use Authorization (EUA). This EUA will remain in effect (meaning this test can be used) for the duration of the COVID-19 declaration under Section 564(b)(1) of the Act, 21 U.S.C. section 360bbb-3(b)(1), unless the authorization is terminated or revoked.  Performed at [facility], [address], [place], [place] [address]   MRSA PCR Screening     Status: None   Collection Time: 09/06/20 12:58 AM   Specimen: Nasopharyngeal  Result Value Ref Range Status   MRSA by PCR NEGATIVE NEGATIVE Final    Comment:        The GeneXpert MRSA Assay (FDA approved for NASAL specimens only), is one component of a comprehensive MRSA colonization surveillance program. It is not intended to diagnose MRSA infection nor to guide or monitor treatment for MRSA infections. Performed at [facility], [address], [place], [place] [address]      Time coordinating discharge: 25 minutes The [place] controlled substances registry was reviewed for this patient prior to filling the <5 days supply controlled substances script.      SIGNED:   Alberteen Sam, MD  [facility] 09/06/2020, 6:31 PM

## 2020-09-06 NOTE — Plan of Care (Signed)
°  Problem: Clinical Measurements: °Goal: Respiratory complications will improve °Outcome: Progressing °  °Problem: Clinical Measurements: °Goal: Cardiovascular complication will be avoided °Outcome: Progressing °  °Problem: Pain Managment: °Goal: General experience of comfort will improve °Outcome: Progressing °  °Problem: Safety: °Goal: Ability to remain free from injury will improve °Outcome: Progressing °  °

## 2020-09-06 NOTE — Progress Notes (Signed)
Completed discharge teaching and patient verbalized understanding of medications and follow-up appointments. Patient gathered all belongings and PIV was removed by RN. No distress noted.  Waiting for pharmacy to deliver medication and then will discharge.

## 2022-09-25 IMAGING — CR DG CHEST 2V
2 series · 2 of 2 positions shown · non-contrast
Comparison: Chest radiographs 09/05/2020 and earlier.

CLINICAL DATA: 24-year-old female with asthma, pneumomediastinum.

EXAM:
CHEST - 2 VIEW

[chest pa]
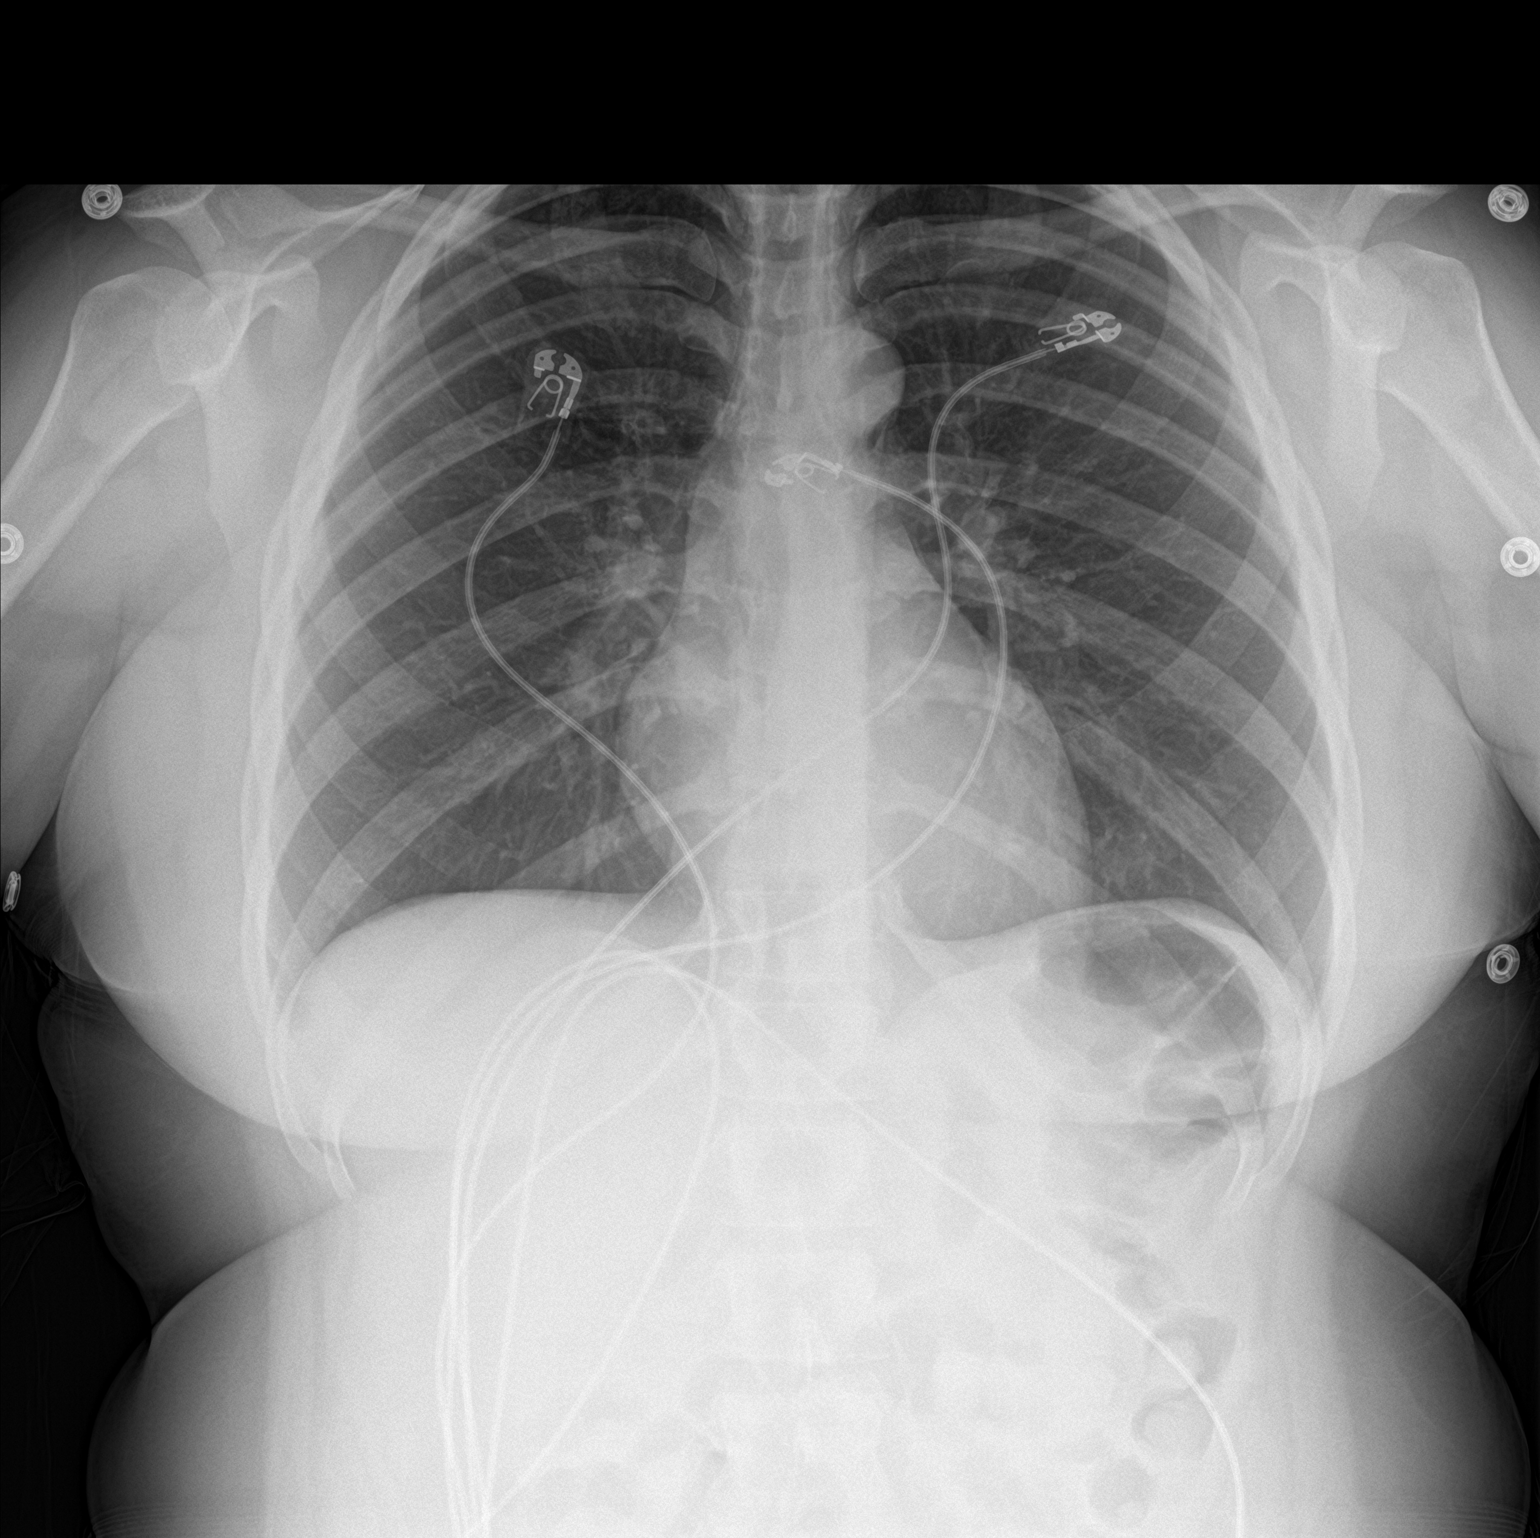

[chest lat]
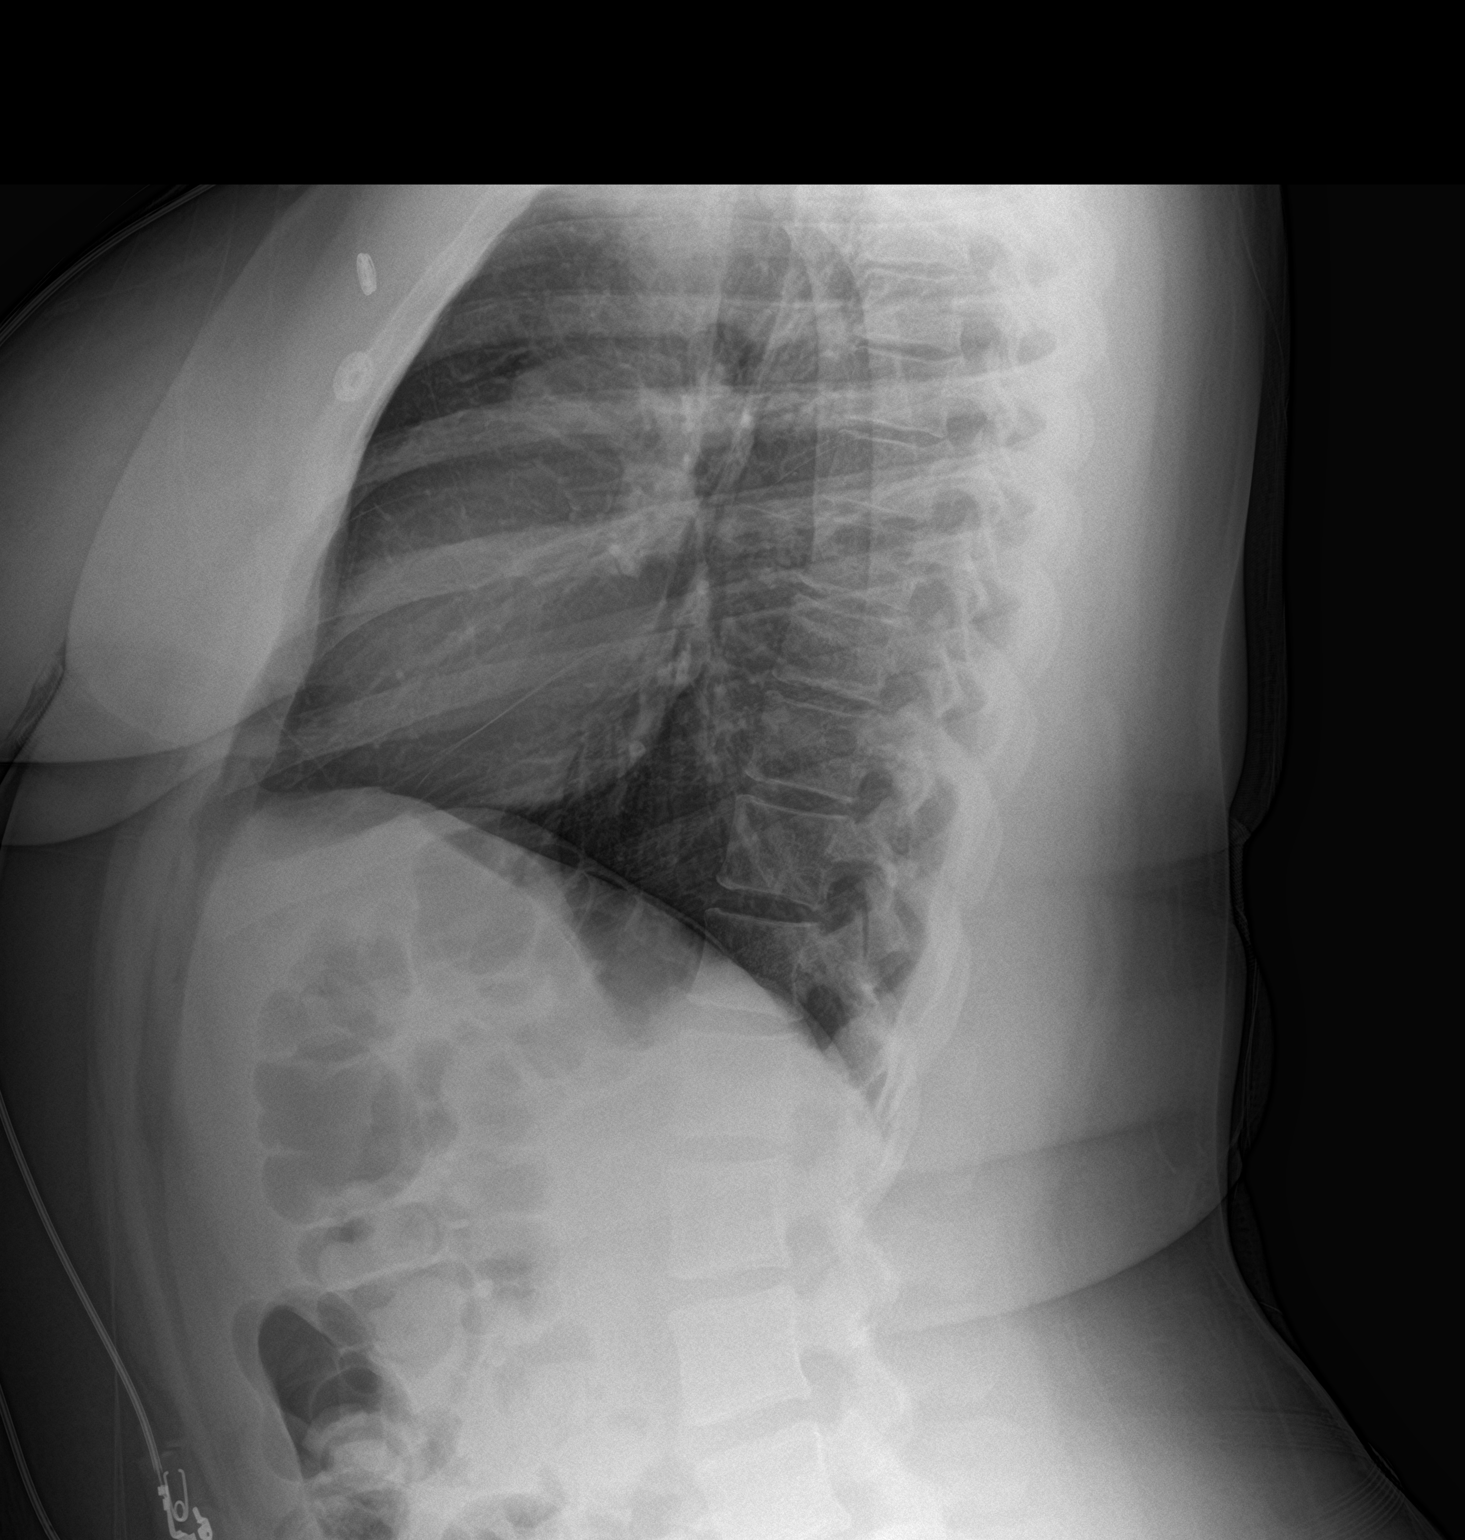

[2 of 2 positions shown; findings below may reference images not displayed]

FINDINGS: Lung volumes and mediastinal contours remain within normal limits.
Visualized tracheal air column is within normal limits. No
pneumothorax or pleural effusion. Lungs remain clear.

Subcutaneous gas at the thoracic inlet redemonstrated and increased
since the time of the 09/04/2020 CT, but not significantly changed
from yesterday.

No osseous abnormality identified. Negative visible bowel gas
pattern.
IMPRESSION: 1. Thoracic inlet subcutaneous emphysema related to
pneumomediastinum, not significantly changed from yesterday.
2. No pneumothorax or new cardiopulmonary abnormality.

## 2023-08-30 ENCOUNTER — Other Ambulatory Visit (HOSPITAL_COMMUNITY): Payer: Self-pay
